# Patient Record
Sex: Female | Born: 1960 | Race: White | Hispanic: No | Marital: Single | Smoking: Former smoker
Health system: Southern US, Community
[De-identification: ages and names within clinical notes are randomized; demographics above are authoritative.]

## PROBLEM LIST (undated history)

## (undated) DIAGNOSIS — I10 Essential (primary) hypertension: Secondary | ICD-10-CM

## (undated) DIAGNOSIS — E785 Hyperlipidemia, unspecified: Secondary | ICD-10-CM

## (undated) DIAGNOSIS — I2510 Atherosclerotic heart disease of native coronary artery without angina pectoris: Secondary | ICD-10-CM

## (undated) DIAGNOSIS — F329 Major depressive disorder, single episode, unspecified: Secondary | ICD-10-CM

## (undated) DIAGNOSIS — F32A Depression, unspecified: Secondary | ICD-10-CM

## (undated) DIAGNOSIS — S022XXA Fracture of nasal bones, initial encounter for closed fracture: Secondary | ICD-10-CM

## (undated) DIAGNOSIS — F419 Anxiety disorder, unspecified: Secondary | ICD-10-CM

## (undated) DIAGNOSIS — K219 Gastro-esophageal reflux disease without esophagitis: Secondary | ICD-10-CM

## (undated) HISTORY — PX: CARPAL TUNNEL RELEASE: SHX101

## (undated) HISTORY — DX: Atherosclerotic heart disease of native coronary artery without angina pectoris: I25.10

## (undated) HISTORY — PX: PLANTAR FASCIA RELEASE: SHX2239

## (undated) HISTORY — PX: ABDOMINAL HYSTERECTOMY: SHX81

## (undated) HISTORY — PX: TONSILLECTOMY: SUR1361

---
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
## 2017-08-15 ENCOUNTER — Emergency Department (HOSPITAL_COMMUNITY): Payer: BC Managed Care – PPO

## 2017-08-15 ENCOUNTER — Other Ambulatory Visit: Payer: Self-pay

## 2017-08-15 ENCOUNTER — Encounter (HOSPITAL_COMMUNITY): Payer: Self-pay | Admitting: Emergency Medicine

## 2017-08-15 ENCOUNTER — Emergency Department (HOSPITAL_COMMUNITY)
Admission: EM | Admit: 2017-08-15 | Discharge: 2017-08-15 | Disposition: A | Discharge status: Home / Self Care | Payer: BC Managed Care – PPO | Attending: Emergency Medicine | Admitting: Emergency Medicine

## 2017-08-15 DIAGNOSIS — M62838 Other muscle spasm: Secondary | ICD-10-CM

## 2017-08-15 DIAGNOSIS — R200 Anesthesia of skin: Secondary | ICD-10-CM | POA: Diagnosis not present

## 2017-08-15 DIAGNOSIS — R4781 Slurred speech: Secondary | ICD-10-CM | POA: Insufficient documentation

## 2017-08-15 DIAGNOSIS — R410 Disorientation, unspecified: Secondary | ICD-10-CM | POA: Diagnosis not present

## 2017-08-15 DIAGNOSIS — Z139 Encounter for screening, unspecified: Secondary | ICD-10-CM

## 2017-08-15 HISTORY — DX: Hyperlipidemia, unspecified: E78.5

## 2017-08-15 HISTORY — DX: Essential (primary) hypertension: I10

## 2017-08-15 LAB — I-STAT TROPONIN, ED: Troponin i, poc: 0 ng/mL (ref 0.00–0.08)

## 2017-08-15 LAB — COMPREHENSIVE METABOLIC PANEL
ALK PHOS: 104 U/L (ref 38–126)
ALT: 16 U/L (ref 0–44)
AST: 23 U/L (ref 15–41)
Albumin: 3.9 g/dL (ref 3.5–5.0)
Anion gap: 10 (ref 5–15)
BILIRUBIN TOTAL: 0.7 mg/dL (ref 0.3–1.2)
BUN: 7 mg/dL (ref 6–20)
CO2: 19 mmol/L — ABNORMAL LOW (ref 22–32)
Calcium: 8.7 mg/dL — ABNORMAL LOW (ref 8.9–10.3)
Chloride: 111 mmol/L (ref 98–111)
Creatinine, Ser: 0.85 mg/dL (ref 0.44–1.00)
GFR calc Af Amer: >60 mL/min (ref >60)
GFR calc non Af Amer: >60 mL/min (ref >60)
Glucose, Bld: 130 mg/dL — ABNORMAL HIGH (ref 70–99)
Potassium: 3.1 mmol/L — ABNORMAL LOW (ref 3.5–5.1)
Sodium: 140 mmol/L (ref 135–145)
TOTAL PROTEIN: 6.6 g/dL (ref 6.5–8.1)

## 2017-08-15 LAB — DIFFERENTIAL
Basophils Absolute: 0 10*3/uL (ref 0.0–0.1)
Basophils Relative: 0 %
Eosinophils Absolute: 0.1 10*3/uL (ref 0.0–0.7)
Eosinophils Relative: 1 %
LYMPHS ABS: 3.9 10*3/uL (ref 0.7–4.0)
LYMPHS PCT: 35 %
Monocytes Absolute: 0.6 10*3/uL (ref 0.1–1.0)
Monocytes Relative: 6 %
NEUTROS ABS: 6.6 10*3/uL (ref 1.7–7.7)
NEUTROS PCT: 58 %

## 2017-08-15 LAB — CK: Total CK: 97 U/L (ref 38–234)

## 2017-08-15 LAB — I-STAT CHEM 8, ED
BUN: 4 mg/dL — AB (ref 6–20)
Calcium, Ion: 1.05 mmol/L — ABNORMAL LOW (ref 1.15–1.40)
Chloride: 111 mmol/L (ref 98–111)
Creatinine, Ser: 0.8 mg/dL (ref 0.44–1.00)
Glucose, Bld: 131 mg/dL — ABNORMAL HIGH (ref 70–99)
HCT: 39 % (ref 36.0–46.0)
Hemoglobin: 13.3 g/dL (ref 12.0–15.0)
Potassium: 3.3 mmol/L — ABNORMAL LOW (ref 3.5–5.1)
Sodium: 142 mmol/L (ref 135–145)
TCO2: 19 mmol/L — ABNORMAL LOW (ref 22–32)

## 2017-08-15 LAB — PROTIME-INR
INR: 0.95
Prothrombin Time: 12.6 seconds (ref 11.4–15.2)

## 2017-08-15 LAB — CBG MONITORING, ED: Glucose-Capillary: 132 mg/dL — ABNORMAL HIGH (ref 70–99)

## 2017-08-15 LAB — APTT: aPTT: 29 seconds (ref 24–36)

## 2017-08-15 LAB — URINALYSIS, ROUTINE W REFLEX MICROSCOPIC
Bilirubin Urine: NEGATIVE
Glucose, UA: NEGATIVE mg/dL
HGB URINE DIPSTICK: NEGATIVE
Ketones, ur: NEGATIVE mg/dL
LEUKOCYTES UA: NEGATIVE
Nitrite: NEGATIVE
PROTEIN: NEGATIVE mg/dL
Specific Gravity, Urine: 1.002 — ABNORMAL LOW (ref 1.005–1.030)
pH: 9 — ABNORMAL HIGH (ref 5.0–8.0)

## 2017-08-15 LAB — CBC
HCT: 39.2 % (ref 36.0–46.0)
HEMOGLOBIN: 13.6 g/dL (ref 12.0–15.0)
MCH: 30.9 pg (ref 26.0–34.0)
MCHC: 34.7 g/dL (ref 30.0–36.0)
MCV: 89.1 fL (ref 78.0–100.0)
Platelets: 268 10*3/uL (ref 150–400)
RBC: 4.4 MIL/uL (ref 3.87–5.11)
RDW: 13.7 % (ref 11.5–15.5)
WBC: 11.2 10*3/uL — ABNORMAL HIGH (ref 4.0–10.5)

## 2017-08-15 LAB — RAPID URINE DRUG SCREEN, HOSP PERFORMED
Amphetamines: NOT DETECTED
BARBITURATES: NOT DETECTED
BENZODIAZEPINES: NOT DETECTED
Cocaine: NOT DETECTED
Opiates: NOT DETECTED
Tetrahydrocannabinol: NOT DETECTED

## 2017-08-15 LAB — MAGNESIUM: MAGNESIUM: 1.9 mg/dL (ref 1.7–2.4)

## 2017-08-15 LAB — ETHANOL: Alcohol, Ethyl (B): <10 mg/dL (ref <10)

## 2017-08-15 MED ORDER — POTASSIUM CHLORIDE CRYS ER 20 MEQ PO TBCR
40.0000 meq | EXTENDED_RELEASE_TABLET | Freq: Once | ORAL | Status: AC
  Administered 2017-08-15: 40 meq via ORAL
  Filled 2017-08-15: qty 2

## 2017-08-15 MED ORDER — DIAZEPAM 2 MG PO TABS
2.0000 mg | ORAL_TABLET | Freq: Once | ORAL | Status: AC
  Administered 2017-08-15: 2 mg via ORAL
  Filled 2017-08-15: qty 1

## 2017-08-15 MED ORDER — SODIUM CHLORIDE 0.9 % IV BOLUS
1000.0000 mL | Freq: Once | INTRAVENOUS | Status: AC
Start: 2017-08-15 — End: 2017-08-15
  Administered 2017-08-15: 1000 mL via INTRAVENOUS

## 2017-08-15 NOTE — ED Triage Notes (Signed)
Per EMS, pt from home. Family called out for for unresponsiveness. Pt was found minimal responsive in bathtub, pt had just come back in from outside. Family reported slurred speech, numbness in bilateral hands. EMS states, upon arrival, pt noted to have LT sided facial droop and slurred speech. Pt also reports spasms in bilateral upper and lower extremities. AOx4. EDP at bedside to evaluate.
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
## 2017-08-15 NOTE — ED Notes (Signed)
TTS in progress 

## 2017-08-15 NOTE — ED Notes (Signed)
EKG given to Dr. McManus.  

## 2017-08-15 NOTE — Consult Note (Signed)
   [facility] Tele[facility]urology Consult Services    Date of Service:   08/15/2017 15:02:21  Impression:      .  Stress related  Metrics: Last Known Well: 08/15/2017 14:30:00 Start Time: 08/15/2017 15:01:20 Arrival Time: 08/15/2017 14:56:00 Stamp Time: 08/15/2017 15:02:21 Time First Login Attempt: 08/15/2017 15:07:00 Video Start Time: 08/15/2017 15:07:00  Symptoms: Unresponsive and hand numbness NIHSS Start Assessment Time: 08/15/2017 15:15:00 Patient is not a candidate for tPA. Patient was not deemed candidate for tPA thrombolytics because of Exam non focal and presentation not suggestive for stroke. Video End Time: 08/15/2017 15:20:00  CT head showed no acute hemorrhage or acute core infarct. CT head was reviewed.  Advanced imaging was not obtained as the presentation was not suggestive of Large Vessel Occlusive Disease.  ER physician notified of the decision on thrombolytics management.  Comments: Toxic and metabolic work up Drug screen Psych consult  Our recommendations are outlined below.  Recommendations:   Routine Consultation with Inhouse Neurology for Follow up Care  Sign Out:      .  Discussed with Emergency Department Provider    ------------------------------------------------------------------------------  History of Present Illness: Patient is a 57 years old Female who presents with symptoms of Unresponsive and hand numbness   Patient was found in the bathtub unresponsive and confused. When arrived to ED she was having bilateral hand numbness and spasming. She was last seen normal somewhere between [phone]. When i saw her she was having a lot of non rhythmic movement, in pain, she is talking and following commands during. Moves all ext equally.  Stroke alert called for EMS presentations  Examination: 1A: Level of Consciousness - Alert; keenly responsive + 0 1B: Ask Month and Age - Both Questions Right + 0 1C: Blink Eyes & Squeeze Hands -  Performs Both Tasks + 0 2: Test Horizontal Extraocular Movements - Normal + 0 3: Test Visual Fields - No Visual Loss + 0 4: Test Facial Palsy (Use Grimace if Obtunded) - Normal symmetry + 0 5A: Test Left Arm Motor Drift - No Drift for 10 Seconds + 0 5B: Test Right Arm Motor Drift - No Drift for 10 Seconds + 0 6A: Test Left Leg Motor Drift - No Drift for 5 Seconds + 0 6B: Test Right Leg Motor Drift - No Drift for 5 Seconds + 0 7: Test Limb Ataxia (FNF/Heel-Shin) - No Ataxia + 0 8: Test Sensation - Normal; No sensory loss + 0 9: Test Language/Aphasia - Normal; No aphasia + 0 10: Test Dysarthria - Normal + 0 11: Test Extinction/Inattention - No abnormality + 0  NIHSS Score: 0  Patient was informed the Neurology Consult would happen via TeleHealth consult by way of interactive audio and video telecommunications and consented to receiving care in this manner.  Due to the immediate potential for life-threatening deterioration due to underlying acute neurologic illness, I spent 35 minutes providing critical care. This time includes time for face to face visit via telemedicine, review of medical records, imaging studies and discussion of findings with providers, the patient and/or family.   Dr Lorrin GoodellYazan Yakir Wenke   [facility] [phone]

## 2017-08-15 NOTE — ED Provider Notes (Signed)
[facility] Provider Note   CSN: [id number] Arrival date & time: 08/15/17  1456     History   Chief Complaint Chief Complaint  Patient presents with  . Code Stroke    HPI Miranda Christensen is a 57 y.o. female.  The history is provided by the patient, the EMS personnel and a relative. The history is limited by the condition of the patient (slurred speech).  Pt was seen at 1455. Per EMS and pt's family report:  Pt called EMS after finding pt "minimally responsive" in the bathtub." Family states pt had "slurred speech, numbness in her bilat hands, left sided facial droop" and were concerned regarding "stroke." Pt apparently had just come in from outside. LKW 30min PTA. EMS states pt was having "spasms" in her bilat UE's en route.   No past medical history on file.  There are no active problems to display for this patient.     OB History   None      Home Medications    Prior to Admission medications   Not on File    Family History No family history on file.  Social History Social History   Tobacco Use  . Smoking status: Not on file  Substance Use Topics  . Alcohol use: Not on file  . Drug use: Not on file     Allergies   Naproxen   Review of Systems Review of Systems  Unable to perform ROS: Mental status change     Physical Exam Updated Vital Signs Ht 5\' 3"  (1.6 m)   Wt 65.8 kg (145 lb)   BMI 25.69 kg/m   Physical Exam 1500: Physical examination:  Nursing notes reviewed; Vital signs and O2 SAT reviewed;  Constitutional: Well developed, Well nourished, Well hydrated, In no acute distress; Head:  Normocephalic, atraumatic; Eyes: EOMI, PERRL, No scleral icterus; ENMT: Mouth and pharynx normal, Mucous membranes moist; Neck: Supple, Full range of motion, No lymphadenopathy; Cardiovascular: Regular rate and rhythm, No gallop; Respiratory: Breath sounds clear & equal bilaterally, No wheezes. Hyperventilating at times. Normal respiratory  effort/excursion; Chest: Nontender, Movement normal; Abdomen: Soft, Nontender, Nondistended, Normal bowel sounds; Genitourinary: No CVA tenderness; Extremities: Peripheral pulses normal, No tenderness, No edema, No calf edema or asymmetry.; Neuro: Awake, alert. Laying eyes closed with her head turned left. Whispering speech, clear. No facial droop. Will not follow commands. Pt will "twitch" her bilat UE's (from shoulders/upper arms) intermittently.  Pt is seen to move her bilat LE's on stretcher spontaneously..; Skin: Color normal, Warm, Dry.     ED Treatments / Results  Labs (all labs ordered are listed, but only abnormal results are displayed)   EKG EKG Interpretation  Date/Time:  Monday August 15 2017 15:05:32 EDT Ventricular Rate:  86 PR Interval:    QRS Duration: 88 QT Interval:  394 QTC Calculation: 472 R Axis:   26 Text Interpretation:  Sinus rhythm Ventricular premature complex Baseline wander Artifact No old tracing to compare Confirmed by Samuel JesterMcManus, Myranda Pavone [phone]([id number]) on 08/15/2017 3:08:26 PM   Radiology   Procedures Procedures (including critical care time)  Medications Ordered in ED Medications  diazepam (VALIUM) tablet 2 mg (has no administration in time range)  sodium chloride 0.9 % bolus 1,000 mL (0 mLs Intravenous Stopped 08/15/17 1631)  potassium chloride SA (K-DUR,KLOR-CON) CR tablet 40 mEq (40 mEq Oral Given 08/15/17 1614)     Initial Impression / Assessment and Plan / ED Course  I have reviewed the triage vital signs and  the nursing notes.  Pertinent labs & imaging results that were available during my care of the patient were reviewed by me and considered in my medical decision making (see chart for details).  MDM Reviewed: nursing note and vitals Reviewed previous: labs Interpretation: labs, ECG and CT scan Total time providing critical care: 30-74 minutes. This excludes time spent performing separately reportable procedures and services. Consults:  neurology and admitting MD    CRITICAL CARE Performed by: Samuel Jester Total critical care time: 35 minutes Critical care time was exclusive of separately billable procedures and treating other patients. Critical care was necessary to treat or prevent imminent or life-threatening deterioration. Critical care was time spent personally by me on the following activities: development of treatment plan with patient and/or surrogate as well as nursing, discussions with consultants, evaluation of patient's response to treatment, examination of patient, obtaining history from patient or surrogate, ordering and performing treatments and interventions, ordering and review of laboratory studies, ordering and review of radiographic studies, pulse oximetry and re-evaluation of patient's condition.  Results for orders placed or performed during the hospital encounter of 08/15/17  CK  Result Value Ref Range   Total CK 97 38 - 234 U/L  Ethanol  Result Value Ref Range   Alcohol, Ethyl (B) <10 <10 mg/dL  Protime-INR  Result Value Ref Range   Prothrombin Time 12.6 11.4 - 15.2 seconds   INR 0.95   APTT  Result Value Ref Range   aPTT 29 24 - 36 seconds  CBC  Result Value Ref Range   WBC 11.2 (H) 4.0 - 10.5 K/uL   RBC 4.40 3.87 - 5.11 MIL/uL   Hemoglobin 13.6 12.0 - 15.0 g/dL   HCT 40.9 81.1 - 91.4 %   MCV 89.1 78.0 - 100.0 fL   MCH 30.9 26.0 - 34.0 pg   MCHC 34.7 30.0 - 36.0 g/dL   RDW 78.2 95.6 - 21.3 %   Platelets 268 150 - 400 K/uL  Differential  Result Value Ref Range   Neutrophils Relative % 58 %   Neutro Abs 6.6 1.7 - 7.7 K/uL   Lymphocytes Relative 35 %   Lymphs Abs 3.9 0.7 - 4.0 K/uL   Monocytes Relative 6 %   Monocytes Absolute 0.6 0.1 - 1.0 K/uL   Eosinophils Relative 1 %   Eosinophils Absolute 0.1 0.0 - 0.7 K/uL   Basophils Relative 0 %   Basophils Absolute 0.0 0.0 - 0.1 K/uL  Comprehensive metabolic panel  Result Value Ref Range   Sodium 140 135 - 145 mmol/L   Potassium  3.1 (L) 3.5 - 5.1 mmol/L   Chloride 111 98 - 111 mmol/L   CO2 19 (L) 22 - 32 mmol/L   Glucose, Bld 130 (H) 70 - 99 mg/dL   BUN 7 6 - 20 mg/dL   Creatinine, Ser 0.86 0.44 - 1.00 mg/dL   Calcium 8.7 (L) 8.9 - 10.3 mg/dL   Total Protein 6.6 6.5 - 8.1 g/dL   Albumin 3.9 3.5 - 5.0 g/dL   AST 23 15 - 41 U/L   ALT 16 0 - 44 U/L   Alkaline Phosphatase 104 38 - 126 U/L   Total Bilirubin 0.7 0.3 - 1.2 mg/dL   GFR calc non Af Amer >60 >60 mL/min   GFR calc Af Amer >60 >60 mL/min   Anion gap 10 5 - 15  Urine rapid drug screen (hosp performed)not at [facility]  Result Value Ref Range   Opiates NONE DETECTED NONE  DETECTED   Cocaine NONE DETECTED NONE DETECTED   Benzodiazepines NONE DETECTED NONE DETECTED   Amphetamines NONE DETECTED NONE DETECTED   Tetrahydrocannabinol NONE DETECTED NONE DETECTED   Barbiturates NONE DETECTED NONE DETECTED  Urinalysis, Routine w reflex microscopic  Result Value Ref Range   Color, Urine STRAW (A) YELLOW   APPearance CLEAR CLEAR   Specific Gravity, Urine 1.002 (L) 1.005 - 1.030   pH 9.0 (H) 5.0 - 8.0   Glucose, UA NEGATIVE NEGATIVE mg/dL   Hgb urine dipstick NEGATIVE NEGATIVE   Bilirubin Urine NEGATIVE NEGATIVE   Ketones, ur NEGATIVE NEGATIVE mg/dL   Protein, ur NEGATIVE NEGATIVE mg/dL   Nitrite NEGATIVE NEGATIVE   Leukocytes, UA NEGATIVE NEGATIVE  Magnesium  Result Value Ref Range   Magnesium 1.9 1.7 - 2.4 mg/dL  I-Stat Chem 8, ED  (not at [facility], [facility])  Result Value Ref Range   Sodium 142 135 - 145 mmol/L   Potassium 3.3 (L) 3.5 - 5.1 mmol/L   Chloride 111 98 - 111 mmol/L   BUN 4 (L) 6 - 20 mg/dL   Creatinine, Ser 1.61 0.44 - 1.00 mg/dL   Glucose, Bld 096 (H) 70 - 99 mg/dL   Calcium, Ion 0.45 (L) 1.15 - 1.40 mmol/L   TCO2 19 (L) 22 - 32 mmol/L   Hemoglobin 13.3 12.0 - 15.0 g/dL   HCT 40.9 81.1 - 91.4 %  I-stat troponin, ED (not at [facility], [facility])  Result Value Ref Range   Troponin i, poc 0.00 0.00 - 0.08 ng/mL   Comment 3           Dg Chest Port 1  View Result Date: 08/15/2017 CLINICAL DATA:  Confusion. EXAM: PORTABLE CHEST 1 VIEW COMPARISON:  None. FINDINGS: The heart size and mediastinal contours are within normal limits. Both lungs are clear. The visualized skeletal structures are unremarkable. IMPRESSION: No active disease. Electronically Signed   By: Gerome Sam III M.D   On: 08/15/2017 16:16   Ct Head Code Stroke Wo Contrast Result Date: 08/15/2017 CLINICAL DATA:  Code stroke. Patient found in bathtub with confusion and slurred speech. EXAM: CT HEAD WITHOUT CONTRAST TECHNIQUE: Contiguous axial images were obtained from the base of the skull through the vertex without intravenous contrast. COMPARISON:  None. FINDINGS: The patient was unable to remain motionless for the exam. Small or subtle lesions could be overlooked. Brain: No evidence for acute infarction, hemorrhage, mass lesion, hydrocephalus, or extra-axial fluid. Normal for age cerebral volume. No definite white matter disease. Vascular: No hyperdense vessel or unexpected calcification. Skull: Normal. Negative for fracture or focal lesion. Sinuses/Orbits: No orbital masses or proptosis. Globes appear symmetric. Sinuses appear well aerated, without evidence for air-fluid level. Other: None. ASPECTS [facility] Stroke Program Early CT Score) - Ganglionic level infarction (caudate, lentiform nuclei, internal capsule, insula, M1-M3 cortex): 7 - Supraganglionic infarction (M4-M6 cortex): 3 Total score (0-10 with 10 being normal): 10 IMPRESSION: 1. Motion degraded scan demonstrating no acute or focal intracranial abnormality. 2. ASPECTS is 10. These results were called by telephone at the time of interpretation on 08/15/2017 at 3:28 pm to Dr. Samuel Jester , who verbally acknowledged these results. Electronically Signed   By: Elsie Stain M.D.   On: 08/15/2017 15:28     1500:  Pt will speak only very softly when questioned. Pt will not follow commands or fully answer examiner's questions. Pt  laying with her head turned left, despite examiner and ED staff standing to her right side and asking questions.  Pt will not follow commands, but is seen moving all extremities spontaneously on stretcher. Pt remains awake/alert while intermittently "twitching" her bilat UE's (shoulers/upper arms) only; her wrists/hands only follow this movement and do not "twitch" on their own. Doubt seizure activity at this time.   1530:  TeleNeuro Dr. Charolotte Capuchin has evaluated pt: states pt is not having neurological event at this time (stroke nor seizure), pt's family members in exam room state pt has "been under a lot of stress (family, financial)," states psych issue is the most likely cause for her symptoms today, recommends to complete tox workup and have psych eval. IVF bolused and potassium repleted PO.   1715:  Pt is now sitting up on stretcher, intermittently "twitching" her shoulders, states she "needs something for the spasms." Pt intermittently hyperventilating, agitated at family in exam room. No clear indication for medical admission at this time. Will dose valium and have TTS evaluate.   1930:  TTS has evaluated pt: preliminary verbal statements from TSS is that pt can be d/c, their note is pending. Family continues agitated, argumentative and demanding with ED staff. There does not appear to be a clear indication for medical admission at this time. T/C returned from [facility] Dr. Sharl Ma, case discussed, including:  HPI, pertinent PM/SHx, VS/PE, dx testing, ED course and treatment:  He has reviewed the chart with me, he agrees with EDP and that there is no criteria to admit pt medically at this time, f/u PMD and Neuro MD.   1945:  Pt no longer in exam room. Eloped.    Final Clinical Impressions(s) / ED Diagnoses   Final diagnoses:  None    ED Discharge Orders    None       Samuel Jester, DO 08/18/17 2235

## 2017-08-15 NOTE — ED Notes (Signed)
Patient noted to have left room without discharge orders.

## 2017-08-15 NOTE — Progress Notes (Signed)
CODE STROKE CALL 1454 BEEPER 1454 EXAM STARTED 1506 EXAM FINISHED 1511 IMAGES SENT TO SOC 1511 COMPLETED/CALLED GR 1518

## 2017-08-15 NOTE — ED Notes (Signed)
Patient unable to void, in and out cath performed, of clear, light yellow urine. Patient tolerated well.

## 2019-02-23 IMAGING — CR DG CHEST 1V PORT
1 series · 1 of 1 positions shown · non-contrast
Comparison: None.

CLINICAL DATA: Confusion.

EXAM:
PORTABLE CHEST 1 VIEW

[pa]
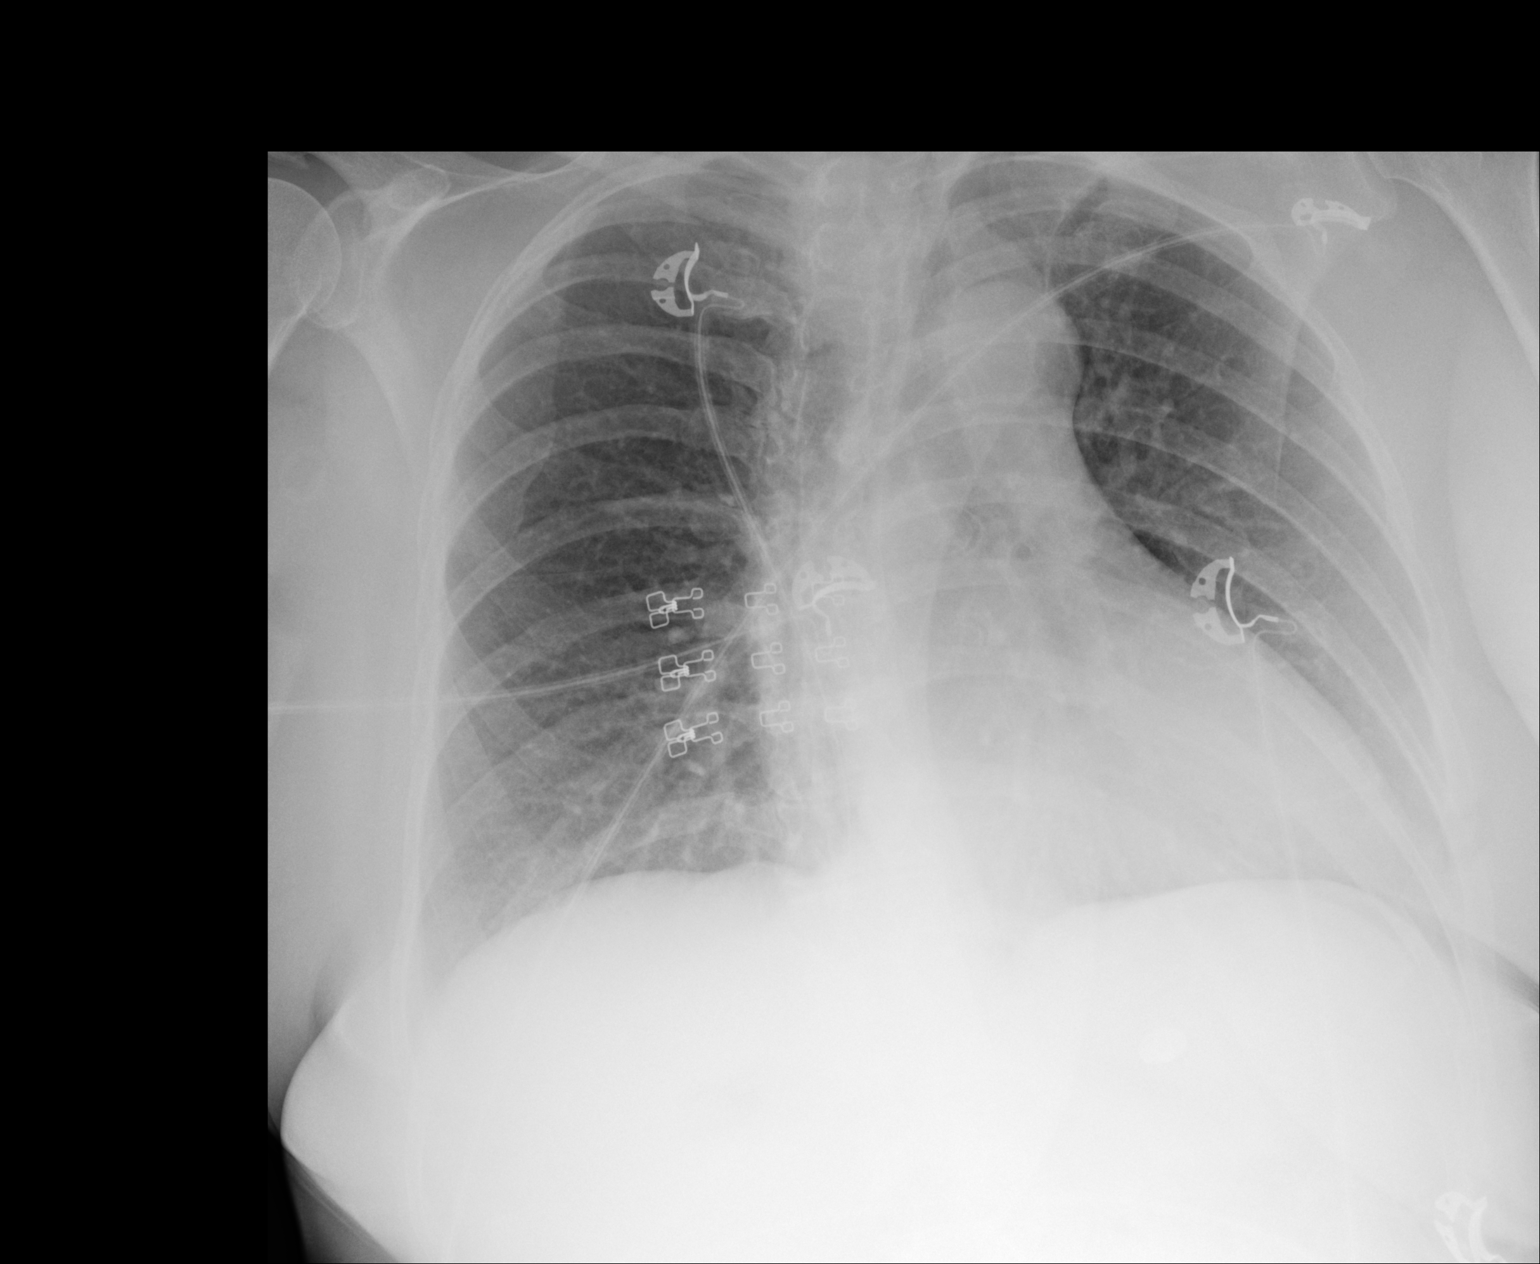

[1 of 1 positions shown; findings below may reference images not displayed]

FINDINGS: The heart size and mediastinal contours are within normal limits.
Both lungs are clear. The visualized skeletal structures are
unremarkable.
IMPRESSION: No active disease.

## 2019-03-21 ENCOUNTER — Other Ambulatory Visit: Payer: Self-pay | Admitting: Otolaryngology

## 2019-03-21 ENCOUNTER — Other Ambulatory Visit: Payer: Self-pay

## 2019-03-21 ENCOUNTER — Encounter (HOSPITAL_BASED_OUTPATIENT_CLINIC_OR_DEPARTMENT_OTHER): Payer: Self-pay | Admitting: Otolaryngology

## 2019-03-22 ENCOUNTER — Other Ambulatory Visit (HOSPITAL_COMMUNITY): Payer: Self-pay

## 2019-03-22 ENCOUNTER — Encounter (HOSPITAL_BASED_OUTPATIENT_CLINIC_OR_DEPARTMENT_OTHER): Payer: Self-pay | Admitting: Otolaryngology

## 2019-03-22 ENCOUNTER — Other Ambulatory Visit (HOSPITAL_COMMUNITY)
Admission: RE | Admit: 2019-03-22 | Discharge: 2019-03-22 | Disposition: A | Discharge status: Home / Self Care | Payer: BC Managed Care – PPO | Source: Ambulatory Visit | Attending: Otolaryngology | Admitting: Otolaryngology

## 2019-03-22 DIAGNOSIS — Z79899 Other long term (current) drug therapy: Secondary | ICD-10-CM | POA: Diagnosis not present

## 2019-03-22 DIAGNOSIS — S022XXA Fracture of nasal bones, initial encounter for closed fracture: Secondary | ICD-10-CM | POA: Diagnosis present

## 2019-03-22 DIAGNOSIS — E785 Hyperlipidemia, unspecified: Secondary | ICD-10-CM | POA: Diagnosis not present

## 2019-03-22 DIAGNOSIS — W19XXXA Unspecified fall, initial encounter: Secondary | ICD-10-CM | POA: Diagnosis not present

## 2019-03-22 DIAGNOSIS — Z87891 Personal history of nicotine dependence: Secondary | ICD-10-CM | POA: Diagnosis not present

## 2019-03-22 DIAGNOSIS — Z20822 Contact with and (suspected) exposure to covid-19: Secondary | ICD-10-CM | POA: Diagnosis not present

## 2019-03-22 DIAGNOSIS — I10 Essential (primary) hypertension: Secondary | ICD-10-CM | POA: Diagnosis not present

## 2019-03-22 DIAGNOSIS — J342 Deviated nasal septum: Secondary | ICD-10-CM | POA: Diagnosis not present

## 2019-03-22 LAB — RESPIRATORY PANEL BY RT PCR (FLU A&B, COVID)
Influenza A by PCR: NEGATIVE
Influenza B by PCR: NEGATIVE
SARS Coronavirus 2 by RT PCR: NEGATIVE

## 2019-03-23 ENCOUNTER — Encounter (HOSPITAL_BASED_OUTPATIENT_CLINIC_OR_DEPARTMENT_OTHER): Payer: Self-pay | Admitting: Otolaryngology

## 2019-03-23 ENCOUNTER — Encounter (HOSPITAL_BASED_OUTPATIENT_CLINIC_OR_DEPARTMENT_OTHER)
Admission: RE | Disposition: A | Discharge status: Home / Self Care | Payer: Self-pay | Source: Home / Self Care | Attending: Otolaryngology

## 2019-03-23 ENCOUNTER — Ambulatory Visit (HOSPITAL_BASED_OUTPATIENT_CLINIC_OR_DEPARTMENT_OTHER)
Admission: RE | Admit: 2019-03-23 | Discharge: 2019-03-23 | Disposition: A | Discharge status: Home / Self Care | Payer: BC Managed Care – PPO | Attending: Otolaryngology | Admitting: Otolaryngology

## 2019-03-23 ENCOUNTER — Ambulatory Visit (HOSPITAL_BASED_OUTPATIENT_CLINIC_OR_DEPARTMENT_OTHER): Payer: BC Managed Care – PPO | Admitting: Certified Registered"

## 2019-03-23 ENCOUNTER — Other Ambulatory Visit: Payer: Self-pay

## 2019-03-23 DIAGNOSIS — I10 Essential (primary) hypertension: Secondary | ICD-10-CM | POA: Insufficient documentation

## 2019-03-23 DIAGNOSIS — J342 Deviated nasal septum: Secondary | ICD-10-CM | POA: Insufficient documentation

## 2019-03-23 DIAGNOSIS — E785 Hyperlipidemia, unspecified: Secondary | ICD-10-CM | POA: Diagnosis not present

## 2019-03-23 DIAGNOSIS — S022XXA Fracture of nasal bones, initial encounter for closed fracture: Secondary | ICD-10-CM | POA: Diagnosis not present

## 2019-03-23 DIAGNOSIS — Z20822 Contact with and (suspected) exposure to covid-19: Secondary | ICD-10-CM | POA: Insufficient documentation

## 2019-03-23 DIAGNOSIS — Z87891 Personal history of nicotine dependence: Secondary | ICD-10-CM | POA: Insufficient documentation

## 2019-03-23 DIAGNOSIS — W19XXXA Unspecified fall, initial encounter: Secondary | ICD-10-CM | POA: Insufficient documentation

## 2019-03-23 DIAGNOSIS — Z79899 Other long term (current) drug therapy: Secondary | ICD-10-CM | POA: Insufficient documentation

## 2019-03-23 HISTORY — PX: CLOSED REDUCTION NASAL FRACTURE: SHX5365

## 2019-03-23 HISTORY — DX: Gastro-esophageal reflux disease without esophagitis: K21.9

## 2019-03-23 HISTORY — DX: Depression, unspecified: F32.A

## 2019-03-23 HISTORY — DX: Fracture of nasal bones, initial encounter for closed fracture: S02.2XXA

## 2019-03-23 HISTORY — DX: Anxiety disorder, unspecified: F41.9

## 2019-03-23 SURGERY — CLOSED REDUCTION, FRACTURE, NASAL BONE
Anesthesia: General | Site: Nose | Laterality: Bilateral

## 2019-03-23 MED ORDER — PHENYLEPHRINE 40 MCG/ML (10ML) SYRINGE FOR IV PUSH (FOR BLOOD PRESSURE SUPPORT)
PREFILLED_SYRINGE | INTRAVENOUS | Status: AC
  Filled 2019-03-23: qty 10

## 2019-03-23 MED ORDER — LACTATED RINGERS IV SOLN: INTRAVENOUS | Status: DC

## 2019-03-23 MED ORDER — FENTANYL CITRATE (PF) 100 MCG/2ML IJ SOLN
INTRAMUSCULAR | Status: AC
  Filled 2019-03-23: qty 2

## 2019-03-23 MED ORDER — SUCCINYLCHOLINE CHLORIDE 200 MG/10ML IV SOSY
PREFILLED_SYRINGE | INTRAVENOUS | Status: AC
  Filled 2019-03-23: qty 10

## 2019-03-23 MED ORDER — LIDOCAINE 2% (20 MG/ML) 5 ML SYRINGE
INTRAMUSCULAR | Status: DC | PRN
  Administered 2019-03-23: 80 mg via INTRAVENOUS

## 2019-03-23 MED ORDER — MIDAZOLAM HCL 2 MG/2ML IJ SOLN
1.0000 mg | INTRAMUSCULAR | Status: DC | PRN
  Administered 2019-03-23: 2 mg via INTRAVENOUS

## 2019-03-23 MED ORDER — PROPOFOL 10 MG/ML IV BOLUS
INTRAVENOUS | Status: DC | PRN
  Administered 2019-03-23: 200 mg via INTRAVENOUS

## 2019-03-23 MED ORDER — DEXAMETHASONE SODIUM PHOSPHATE 4 MG/ML IJ SOLN
INTRAMUSCULAR | Status: DC | PRN
  Administered 2019-03-23: 10 mg via INTRAVENOUS

## 2019-03-23 MED ORDER — EPHEDRINE 5 MG/ML INJ
INTRAVENOUS | Status: AC
  Filled 2019-03-23: qty 10

## 2019-03-23 MED ORDER — DEXAMETHASONE SODIUM PHOSPHATE 10 MG/ML IJ SOLN
INTRAMUSCULAR | Status: AC
  Filled 2019-03-23: qty 1

## 2019-03-23 MED ORDER — LIDOCAINE 2% (20 MG/ML) 5 ML SYRINGE
INTRAMUSCULAR | Status: AC
  Filled 2019-03-23: qty 5

## 2019-03-23 MED ORDER — FENTANYL CITRATE (PF) 100 MCG/2ML IJ SOLN: 25.0000 ug | INTRAMUSCULAR | Status: DC | PRN

## 2019-03-23 MED ORDER — GLYCOPYRROLATE 0.2 MG/ML IJ SOLN
INTRAMUSCULAR | Status: DC | PRN
  Administered 2019-03-23: .2 mg via INTRAVENOUS

## 2019-03-23 MED ORDER — ONDANSETRON HCL 4 MG/2ML IJ SOLN
INTRAMUSCULAR | Status: DC | PRN
  Administered 2019-03-23: 4 mg via INTRAVENOUS

## 2019-03-23 MED ORDER — ONDANSETRON HCL 4 MG/2ML IJ SOLN
INTRAMUSCULAR | Status: AC
  Filled 2019-03-23: qty 2

## 2019-03-23 MED ORDER — FENTANYL CITRATE (PF) 100 MCG/2ML IJ SOLN
50.0000 ug | INTRAMUSCULAR | Status: DC | PRN
  Administered 2019-03-23: 100 ug via INTRAVENOUS

## 2019-03-23 MED ORDER — OXYMETAZOLINE HCL 0.05 % NA SOLN
NASAL | Status: DC | PRN
  Administered 2019-03-23: 1 via TOPICAL

## 2019-03-23 MED ORDER — MIDAZOLAM HCL 2 MG/2ML IJ SOLN
INTRAMUSCULAR | Status: AC
  Filled 2019-03-23: qty 2

## 2019-03-23 MED ORDER — FENTANYL CITRATE (PF) 100 MCG/2ML IJ SOLN
25.0000 ug | INTRAMUSCULAR | Status: DC | PRN
  Administered 2019-03-23: 50 ug via INTRAVENOUS

## 2019-03-23 SURGICAL SUPPLY — 29 items
BENZOIN TINCTURE PRP APPL 2/3 (GAUZE/BANDAGES/DRESSINGS) ×2 IMPLANT
CANISTER SUCT 1200ML W/VALVE (MISCELLANEOUS) ×2 IMPLANT
CNTNR URN SCR LID CUP LEK RST (MISCELLANEOUS) ×1 IMPLANT
CONT SPEC 4OZ STRL OR WHT (MISCELLANEOUS) ×2
COVER BACK TABLE 60X90IN (DRAPES) ×2 IMPLANT
COVER MAYO STAND STRL (DRAPES) ×2 IMPLANT
DECANTER SPIKE VIAL GLASS SM (MISCELLANEOUS) IMPLANT
DEPRESSOR TONGUE BLADE STERILE (MISCELLANEOUS) IMPLANT
DRSG CURAD 3X16 NADH (PACKING) IMPLANT
DRSG TELFA 3X8 NADH (GAUZE/BANDAGES/DRESSINGS) IMPLANT
GAUZE PACKING IODOFORM 1/2 (PACKING) IMPLANT
GAUZE SPONGE 4X4 12PLY STRL LF (GAUZE/BANDAGES/DRESSINGS) ×2 IMPLANT
GLOVE BIO SURGEON STRL SZ7.5 (GLOVE) ×2 IMPLANT
GLOVE BIOGEL PI IND STRL 7.0 (GLOVE) ×1 IMPLANT
GLOVE BIOGEL PI INDICATOR 7.0 (GLOVE) ×1
GLOVE ECLIPSE 6.5 STRL STRAW (GLOVE) ×2 IMPLANT
NEEDLE PRECISIONGLIDE 27X1.5 (NEEDLE) IMPLANT
PATTIES SURGICAL .5 X3 (DISPOSABLE) ×2 IMPLANT
SHEET MEDIUM DRAPE 40X70 STRL (DRAPES) ×2 IMPLANT
SHEET SILICONE 2X3 0.03 REINF (MISCELLANEOUS) IMPLANT
SPLINT NASAL THERMO PLAST (MISCELLANEOUS) ×2 IMPLANT
SPONGE GAUZE 2X2 8PLY STRL LF (GAUZE/BANDAGES/DRESSINGS) IMPLANT
STRIP CLOSURE SKIN 1/2X4 (GAUZE/BANDAGES/DRESSINGS) ×2 IMPLANT
STRIP CLOSURE SKIN 1/4X4 (GAUZE/BANDAGES/DRESSINGS) IMPLANT
SUT ETHILON 3 0 PS 1 (SUTURE) IMPLANT
SYR CONTROL 10ML LL (SYRINGE) IMPLANT
TOWEL GREEN STERILE FF (TOWEL DISPOSABLE) ×2 IMPLANT
TUBE CONNECTING 20X1/4 (TUBING) ×2 IMPLANT
YANKAUER SUCT BULB TIP NO VENT (SUCTIONS) IMPLANT

## 2019-03-23 NOTE — Anesthesia Preprocedure Evaluation (Addendum)
Anesthesia Evaluation  Patient identified by MRN, date of birth, ID band Patient awake    Airway Mallampati: II  TM Distance: >3 FB     Dental   Pulmonary former smoker,    breath sounds clear to auscultation       Cardiovascular hypertension,  Rhythm:Regular Rate:Normal     Neuro/Psych    GI/Hepatic Neg liver ROS, GERD  ,  Endo/Other  negative endocrine ROS  Renal/GU negative Renal ROS     Musculoskeletal   Abdominal   Peds  Hematology   Anesthesia Other Findings   Reproductive/Obstetrics                             Anesthesia Physical Anesthesia Plan  ASA: II  Anesthesia Plan: General   Post-op Pain Management:    Induction: Intravenous  PONV Risk Score and Plan: 3 and Ondansetron and Dexamethasone  Airway Management Planned: LMA  Additional Equipment:   Intra-op Plan:   Post-operative Plan: Extubation in OR  Informed Consent: I have reviewed the patients History and Physical, chart, labs and discussed the procedure including the risks, benefits and alternatives for the proposed anesthesia with the patient or authorized representative who has indicated his/her understanding and acceptance.     Dental advisory given  Plan Discussed with: CRNA and Anesthesiologist  Anesthesia Plan Comments:         Anesthesia Quick Evaluation

## 2019-03-23 NOTE — Brief Op Note (Signed)
03/23/2019  12:14 PM  PATIENT:  Miranda Christensen  59 y.o. female  PRE-OPERATIVE DIAGNOSIS:  nasal fracture  POST-OPERATIVE DIAGNOSIS:  nasal fracture  PROCEDURE:  Procedure(s): CLOSED REDUCTION NASAL FRACTURE (Bilateral)  SURGEON:  Surgeon(s) and Role:    Christia Reading, MD - Primary  PHYSICIAN ASSISTANT:   ASSISTANTS: none   ANESTHESIA:   general  EBL:  10 mL   BLOOD ADMINISTERED:none  DRAINS: none   LOCAL MEDICATIONS USED:  NONE  SPECIMEN:  No Specimen  DISPOSITION OF SPECIMEN:  N/A  COUNTS:  YES  TOURNIQUET:  * No tourniquets in log *  DICTATION: .Other Dictation: Dictation Number [id number]  PLAN OF CARE: Discharge to home after PACU  PATIENT DISPOSITION:  PACU - hemodynamically stable.   Delay start of Pharmacological VTE agent (>24hrs) due to surgical blood loss or risk of bleeding: no

## 2019-03-23 NOTE — H&P (Signed)
Miranda Christensen is an 59 y.o. female.   Chief Complaint: Nasal fracture HPI: 59 year old female with displaced nasal fracture about two weeks ago.  Past Medical History:  Diagnosis Date  . Anxiety   . Depression   . GERD (gastroesophageal reflux disease)   . Hyperlipidemia   . Hypertension   . Nasal fracture     Past Surgical History:  Procedure Laterality Date  . ABDOMINAL HYSTERECTOMY    . CARPAL TUNNEL RELEASE Bilateral   . PLANTAR FASCIA RELEASE Bilateral   . TONSILLECTOMY      History reviewed. No pertinent family history. Social History:  reports that she has quit smoking. She has never used smokeless tobacco. She reports current alcohol use. She reports that she does not use drugs.  Allergies:  Allergies  Allergen Reactions  . Naproxen Nausea And Vomiting    Medications Prior to Admission  Medication Sig Dispense Refill  . estrogens, conjugated, (PREMARIN) 0.45 MG tablet Take 0.45 mg by mouth daily. Take daily for 21 days then do not take for 7 days.    Marland Kitchen lisinopril (PRINIVIL,ZESTRIL) 10 MG tablet Take 10 mg by mouth daily.  0  . montelukast (SINGULAIR) 10 MG tablet Take 10 mg by mouth every morning.   1  . pramipexole (MIRAPEX) 0.125 MG tablet Take 1 tablet by mouth at bedtime as needed (for restless leg).   1  . simvastatin (ZOCOR) 40 MG tablet Take 1 tablet by mouth daily.  0  . zolpidem (AMBIEN) 10 MG tablet Take 1 tablet by mouth at bedtime.   2    Results for orders placed or performed during the hospital encounter of 03/22/19 (from the past 48 hour(s))  Respiratory Panel by RT PCR (Flu A&B, Covid) - Nasopharyngeal Swab     Status: None   Collection Time: 03/22/19  8:09 AM   Specimen: Nasopharyngeal Swab  Result Value Ref Range   SARS Coronavirus 2 by RT PCR NEGATIVE NEGATIVE    Comment: (NOTE) SARS-CoV-2 target nucleic acids are NOT DETECTED. The SARS-CoV-2 RNA is generally detectable in upper respiratoy specimens during the acute phase of infection. The  lowest concentration of SARS-CoV-2 viral copies this assay can detect is 131 copies/mL. A negative result does not preclude SARS-Cov-2 infection and should not be used as the sole basis for treatment or other patient management decisions. A negative result may occur with  improper specimen collection/handling, submission of specimen other than nasopharyngeal swab, presence of viral mutation(s) within the areas targeted by this assay, and inadequate number of viral copies (<131 copies/mL). A negative result must be combined with clinical observations, patient history, and epidemiological information. The expected result is Negative. Fact Sheet for Patients:  https://www.moore.com/ Fact Sheet for Healthcare Providers:  https://www.young.biz/ This test is not yet ap proved or cleared by the [place] FDA and  has been authorized for detection and/or diagnosis of SARS-CoV-2 by FDA under an Emergency Use Authorization (EUA). This EUA will remain  in effect (meaning this test can be used) for the duration of the COVID-19 declaration under Section 564(b)(1) of the Act, 21 U.S.C. section 360bbb-3(b)(1), unless the authorization is terminated or revoked sooner.    Influenza A by PCR NEGATIVE NEGATIVE   Influenza B by PCR NEGATIVE NEGATIVE    Comment: (NOTE) The Xpert Xpress SARS-CoV-2/FLU/RSV assay is intended as an aid in  the diagnosis of influenza from Nasopharyngeal swab specimens and  should not be used as a sole basis for treatment. Nasal  washings and  aspirates are unacceptable for Xpert Xpress SARS-CoV-2/FLU/RSV  testing. Fact Sheet for Patients: PinkCheek.be Fact Sheet for Healthcare Providers: GravelBags.it This test is not yet approved or cleared by the [place] FDA and  has been authorized for detection and/or diagnosis of SARS-CoV-2 by  FDA under an Emergency Use  Authorization (EUA). This EUA will remain  in effect (meaning this test can be used) for the duration of the  Covid-19 declaration under Section 564(b)(1) of the Act, 21  U.S.C. section 360bbb-3(b)(1), unless the authorization is  terminated or revoked. Performed at [facility], [place] [address] [place], [place] [address]    No results found.  Review of Systems  All other systems reviewed and are negative.   Blood pressure 132/87, pulse 84, temperature (!) 97.1 F (36.2 C), temperature source Oral, resp. rate 16, height 5\' 3"  (1.6 m), weight 75.5 kg, SpO2 100 %. Physical Exam  Constitutional: She is oriented to person, place, and time. She appears well-developed and well-nourished. No distress.  HENT:  Head: Normocephalic and atraumatic.  Right Ear: External ear normal.  Left Ear: External ear normal.  Mouth/Throat: Oropharynx is clear and moist.  Rightward deviated external nose.  Eyes: Pupils are equal, round, and reactive to light. Conjunctivae and EOM are normal.  Cardiovascular: Normal rate.  Respiratory: Effort normal.  Musculoskeletal:     Cervical back: Normal range of motion and neck supple.  Neurological: She is alert and oriented to person, place, and time. No cranial nerve deficit.  Skin: Skin is warm and dry.  Psychiatric: She has a normal mood and affect. Her behavior is normal. Judgment and thought content normal.     Assessment/Plan Nasal fracture To OR for closed nasal reduction.  Melida Quitter, MD 03/23/2019, 11:36 AM

## 2019-03-23 NOTE — Discharge Instructions (Signed)

## 2019-03-23 NOTE — Anesthesia Procedure Notes (Signed)
Procedure Name: LMA Insertion Date/Time: 03/23/2019 11:54 AM Performed by: Ronnette Hila, CRNA Pre-anesthesia Checklist: Patient identified, Emergency Drugs available, Suction available and Patient being monitored Patient Re-evaluated:Patient Re-evaluated prior to induction Oxygen Delivery Method: Circle system utilized Preoxygenation: Pre-oxygenation with 100% oxygen Induction Type: IV induction Ventilation: Mask ventilation without difficulty LMA: LMA inserted LMA Size: 4.0 Number of attempts: 1 Airway Equipment and Method: Bite block Placement Confirmation: positive ETCO2 Tube secured with: Tape Dental Injury: Teeth and Oropharynx as per pre-operative assessment

## 2019-03-23 NOTE — Anesthesia Postprocedure Evaluation (Signed)
Anesthesia Post Note  Patient: Miranda Christensen  Procedure(s) Performed: CLOSED REDUCTION NASAL FRACTURE (Bilateral Nose)     Patient location during evaluation: PACU Anesthesia Type: General Level of consciousness: awake Pain management: pain level controlled Vital Signs Assessment: post-procedure vital signs reviewed and stable Respiratory status: spontaneous breathing Cardiovascular status: stable Postop Assessment: no apparent nausea or vomiting Anesthetic complications: no    Last Vitals:  Vitals:   03/23/19 1218 03/23/19 1230  BP: 135/90 (!) 139/94  Pulse: 96 88  Resp: 16 17  Temp: 36.6 C   SpO2: 100% 99%    Last Pain:  Vitals:   03/23/19 1230  TempSrc:   PainSc: 4                  Aldin Drees

## 2019-03-23 NOTE — Transfer of Care (Signed)
Immediate Anesthesia Transfer of Care Note  Patient: Miranda Christensen  Procedure(s) Performed: CLOSED REDUCTION NASAL FRACTURE (Bilateral Nose)  Patient Location: PACU  Anesthesia Type:General  Level of Consciousness: awake, alert , oriented and drowsy  Airway & Oxygen Therapy: Patient Spontanous Breathing and Patient connected to face mask oxygen  Post-op Assessment: Report given to RN and Post -op Vital signs reviewed and stable  Post vital signs: Reviewed and stable  Last Vitals:  Vitals Value Taken Time  BP 150/93 03/23/19 1217  Temp    Pulse 96 03/23/19 1219  Resp 12 03/23/19 1219  SpO2 100 % 03/23/19 1219  Vitals shown include unvalidated device data.  Last Pain:  Vitals:   03/23/19 1030  TempSrc: Oral  PainSc: 0-No pain         Complications: No apparent anesthesia complications

## 2019-03-23 NOTE — Op Note (Signed)
NAME: ERI, MCEVERS MEDICAL RECORD TD:[id number] ACCOUNT [id number] DATE OF BIRTH:[birth year] FACILITY: [facility] LOCATION: [facility] PHYSICIAN:Omario Ander DJenne Pane, MD  OPERATIVE REPORT  DATE OF PROCEDURE:  03/23/2019  PREOPERATIVE DIAGNOSIS:  Displaced nasal fracture.  POSTOPERATIVE DIAGNOSIS:  Displaced nasal fracture.  PROCEDURE:  Closed nasal reduction.  SURGEON:  Christia Reading, MD  ANESTHESIA:  General LMA.  COMPLICATIONS:  None.  INDICATIONS:  The patient is a 59 year old female who fell in the middle of the night a couple of weeks ago while dizzy and sustained a rightward displaced nasal fracture.  She presents to the operating room for surgical management.  FINDINGS:  The external nose is deviated toward the right, causing some limited breathing in the left side.  DESCRIPTION OF PROCEDURE:  The patient was identified in the holding room, informed consent having been obtained including discussion of risks, benefits and alternatives, the patient was brought to the operative suite and put on the operative table in  the supine position.  Anesthesia was induced and the patient was intubated with an LMA without difficulty.  The eyes were taped closed.  The face was draped.  Afrin-soaked pledgets were placed in both sides of the nose for several minutes and then  removed.  A Goldman elevator was then used to elevate the left nasal bones and reduce the nose in both sides to the midline using bimanual manipulation.  After this was completed, Afrin pledgets were repositioned in the nose.  The external nose was  painted with benzoin and custom-cut Steri-Strips were placed.  A thermoplast splint was cut to fit the nose and placed in hot water until malleable and laid over the nose until it hardened into place.  Pledgets were removed and the nasal passages were  suctioned.  She does have a leftward septal deviation that does not appear to be related to her injury.  At this point, she was returned to  anesthesia for wakeup and was extubated and taken to the recovery room in stable condition.  VN/NUANCE  D:03/23/2019 T:03/23/2019 JOB:[id number]

## 2019-03-26 ENCOUNTER — Encounter: Payer: Self-pay | Admitting: *Deleted

## 2021-06-06 ENCOUNTER — Other Ambulatory Visit: Payer: Self-pay

## 2021-06-06 ENCOUNTER — Emergency Department (HOSPITAL_COMMUNITY): Payer: BC Managed Care – PPO

## 2021-06-06 ENCOUNTER — Encounter (HOSPITAL_COMMUNITY): Payer: Self-pay | Admitting: Emergency Medicine

## 2021-06-06 ENCOUNTER — Inpatient Hospital Stay (HOSPITAL_COMMUNITY)
Admission: EM | Admit: 2021-06-06 | Discharge: 2021-06-08 | DRG: 282 | Disposition: A | Discharge status: Home / Self Care | Payer: BC Managed Care – PPO | Attending: Internal Medicine | Admitting: Internal Medicine

## 2021-06-06 ENCOUNTER — Observation Stay (HOSPITAL_COMMUNITY): Payer: BC Managed Care – PPO

## 2021-06-06 DIAGNOSIS — F32A Depression, unspecified: Secondary | ICD-10-CM | POA: Diagnosis present

## 2021-06-06 DIAGNOSIS — R079 Chest pain, unspecified: Principal | ICD-10-CM | POA: Diagnosis present

## 2021-06-06 DIAGNOSIS — E785 Hyperlipidemia, unspecified: Secondary | ICD-10-CM | POA: Diagnosis present

## 2021-06-06 DIAGNOSIS — Z90710 Acquired absence of both cervix and uterus: Secondary | ICD-10-CM

## 2021-06-06 DIAGNOSIS — Z809 Family history of malignant neoplasm, unspecified: Secondary | ICD-10-CM

## 2021-06-06 DIAGNOSIS — I214 Non-ST elevation (NSTEMI) myocardial infarction: Secondary | ICD-10-CM | POA: Diagnosis present

## 2021-06-06 DIAGNOSIS — I249 Acute ischemic heart disease, unspecified: Secondary | ICD-10-CM | POA: Diagnosis present

## 2021-06-06 DIAGNOSIS — I10 Essential (primary) hypertension: Secondary | ICD-10-CM | POA: Diagnosis present

## 2021-06-06 DIAGNOSIS — F419 Anxiety disorder, unspecified: Secondary | ICD-10-CM | POA: Diagnosis present

## 2021-06-06 DIAGNOSIS — R0789 Other chest pain: Secondary | ICD-10-CM | POA: Diagnosis present

## 2021-06-06 DIAGNOSIS — K219 Gastro-esophageal reflux disease without esophagitis: Secondary | ICD-10-CM | POA: Diagnosis present

## 2021-06-06 DIAGNOSIS — Z87891 Personal history of nicotine dependence: Secondary | ICD-10-CM | POA: Diagnosis not present

## 2021-06-06 DIAGNOSIS — Z6832 Body mass index (BMI) 32.0-32.9, adult: Secondary | ICD-10-CM

## 2021-06-06 DIAGNOSIS — I25110 Atherosclerotic heart disease of native coronary artery with unstable angina pectoris: Secondary | ICD-10-CM | POA: Diagnosis present

## 2021-06-06 DIAGNOSIS — E663 Overweight: Secondary | ICD-10-CM | POA: Diagnosis present

## 2021-06-06 DIAGNOSIS — Z79899 Other long term (current) drug therapy: Secondary | ICD-10-CM

## 2021-06-06 DIAGNOSIS — R778 Other specified abnormalities of plasma proteins: Secondary | ICD-10-CM

## 2021-06-06 HISTORY — DX: Depression, unspecified: F32.A

## 2021-06-06 HISTORY — DX: Anxiety disorder, unspecified: F41.9

## 2021-06-06 LAB — HEMOGLOBIN A1C
Hgb A1c MFr Bld: 5.9 % — ABNORMAL HIGH (ref 4.8–5.6)
Mean Plasma Glucose: 122.63 mg/dL

## 2021-06-06 LAB — RAPID URINE DRUG SCREEN, HOSP PERFORMED
Amphetamines: NOT DETECTED
Barbiturates: NOT DETECTED
Benzodiazepines: NOT DETECTED
Cocaine: NOT DETECTED
Opiates: NOT DETECTED
Tetrahydrocannabinol: NOT DETECTED

## 2021-06-06 LAB — CBC
HCT: 42.1 % (ref 36.0–46.0)
Hemoglobin: 13.9 g/dL (ref 12.0–15.0)
MCH: 29.4 pg (ref 26.0–34.0)
MCHC: 33 g/dL (ref 30.0–36.0)
MCV: 89.2 fL (ref 80.0–100.0)
Platelets: 301 10*3/uL (ref 150–400)
RBC: 4.72 MIL/uL (ref 3.87–5.11)
RDW: 14.2 % (ref 11.5–15.5)
WBC: 9.7 10*3/uL (ref 4.0–10.5)
nRBC: 0 % (ref 0.0–0.2)

## 2021-06-06 LAB — LIPID PANEL
Cholesterol: 203 mg/dL — ABNORMAL HIGH (ref 0–200)
HDL: 76 mg/dL (ref >40)
LDL Cholesterol: 112 mg/dL — ABNORMAL HIGH (ref 0–99)
Total CHOL/HDL Ratio: 2.7 RATIO
Triglycerides: 77 mg/dL (ref <150)
VLDL: 15 mg/dL (ref 0–40)

## 2021-06-06 LAB — ECHOCARDIOGRAM COMPLETE
Area-P 1/2: 4.49 cm2
Height: 63 in
S' Lateral: 2.9 cm
Weight: 2916.8 oz

## 2021-06-06 LAB — BASIC METABOLIC PANEL
Anion gap: 12 (ref 5–15)
BUN: 10 mg/dL (ref 6–20)
CO2: 22 mmol/L (ref 22–32)
Calcium: 9 mg/dL (ref 8.9–10.3)
Chloride: 103 mmol/L (ref 98–111)
Creatinine, Ser: 1.09 mg/dL — ABNORMAL HIGH (ref 0.44–1.00)
GFR, Estimated: 58 mL/min — ABNORMAL LOW (ref >60)
Glucose, Bld: 150 mg/dL — ABNORMAL HIGH (ref 70–99)
Potassium: 4.5 mmol/L (ref 3.5–5.1)
Sodium: 137 mmol/L (ref 135–145)

## 2021-06-06 LAB — PROTIME-INR
INR: 0.9 (ref 0.8–1.2)
Prothrombin Time: 12.3 seconds (ref 11.4–15.2)

## 2021-06-06 LAB — HEPARIN LEVEL (UNFRACTIONATED): Heparin Unfractionated: 0.15 IU/mL — ABNORMAL LOW (ref 0.30–0.70)

## 2021-06-06 LAB — TSH: TSH: 1.615 u[IU]/mL (ref 0.350–4.500)

## 2021-06-06 LAB — TROPONIN I (HIGH SENSITIVITY)
Troponin I (High Sensitivity): 16 ng/L (ref <18)
Troponin I (High Sensitivity): 63 ng/L — ABNORMAL HIGH (ref <18)
Troponin I (High Sensitivity): 85 ng/L — ABNORMAL HIGH (ref <18)
Troponin I (High Sensitivity): 83 ng/L — ABNORMAL HIGH (ref <18)

## 2021-06-06 LAB — I-STAT BETA HCG BLOOD, ED (MC, WL, AP ONLY): I-stat hCG, quantitative: <5 m[IU]/mL (ref <5)

## 2021-06-06 LAB — MAGNESIUM: Magnesium: 2.1 mg/dL (ref 1.7–2.4)

## 2021-06-06 MED ORDER — METOPROLOL SUCCINATE ER 25 MG PO TB24
25.0000 mg | ORAL_TABLET | Freq: Every day | ORAL | Status: DC
  Administered 2021-06-06: 25 mg via ORAL
  Filled 2021-06-06 (×2): qty 1

## 2021-06-06 MED ORDER — HEPARIN BOLUS VIA INFUSION
4000.0000 [IU] | Freq: Once | INTRAVENOUS | Status: AC
  Administered 2021-06-06: 4000 [IU] via INTRAVENOUS
  Filled 2021-06-06: qty 4000

## 2021-06-06 MED ORDER — MONTELUKAST SODIUM 10 MG PO TABS
10.0000 mg | ORAL_TABLET | Freq: Every morning | ORAL | Status: DC
  Administered 2021-06-06 – 2021-06-08 (×3): 10 mg via ORAL
  Filled 2021-06-06 (×3): qty 1

## 2021-06-06 MED ORDER — ONDANSETRON HCL 4 MG/2ML IJ SOLN: 4.0000 mg | Freq: Four times a day (QID) | INTRAMUSCULAR | Status: DC | PRN

## 2021-06-06 MED ORDER — NITROGLYCERIN 0.4 MG SL SUBL: 0.4000 mg | SUBLINGUAL_TABLET | SUBLINGUAL | Status: DC | PRN

## 2021-06-06 MED ORDER — HEPARIN (PORCINE) 25000 UT/250ML-% IV SOLN
1000.0000 [IU]/h | INTRAVENOUS | Status: DC
  Administered 2021-06-06: 850 [IU]/h via INTRAVENOUS
  Administered 2021-06-07 – 2021-06-08 (×2): 1000 [IU]/h via INTRAVENOUS
  Filled 2021-06-06 (×3): qty 250

## 2021-06-06 MED ORDER — ENOXAPARIN SODIUM 40 MG/0.4ML IJ SOSY: 40.0000 mg | PREFILLED_SYRINGE | INTRAMUSCULAR | Status: DC

## 2021-06-06 MED ORDER — ACETAMINOPHEN 325 MG PO TABS: 650.0000 mg | ORAL_TABLET | Freq: Four times a day (QID) | ORAL | Status: DC | PRN

## 2021-06-06 MED ORDER — CYCLOSPORINE 0.05 % OP EMUL
1.0000 [drp] | Freq: Two times a day (BID) | OPHTHALMIC | Status: DC
  Administered 2021-06-07 – 2021-06-08 (×3): 1 [drp] via OPHTHALMIC
  Filled 2021-06-06 (×6): qty 30

## 2021-06-06 MED ORDER — LISINOPRIL 10 MG PO TABS
10.0000 mg | ORAL_TABLET | Freq: Every day | ORAL | Status: DC
  Administered 2021-06-06 – 2021-06-08 (×3): 10 mg via ORAL
  Filled 2021-06-06 (×3): qty 1

## 2021-06-06 MED ORDER — ASPIRIN 81 MG PO CHEW
81.0000 mg | CHEWABLE_TABLET | Freq: Every day | ORAL | Status: DC
  Administered 2021-06-06 – 2021-06-07 (×2): 81 mg via ORAL
  Filled 2021-06-06 (×2): qty 1

## 2021-06-06 MED ORDER — ACETAMINOPHEN 650 MG RE SUPP: 650.0000 mg | Freq: Four times a day (QID) | RECTAL | Status: DC | PRN

## 2021-06-06 MED ORDER — SIMVASTATIN 20 MG PO TABS
40.0000 mg | ORAL_TABLET | Freq: Every day | ORAL | Status: DC
  Administered 2021-06-06: 40 mg via ORAL
  Filled 2021-06-06: qty 2

## 2021-06-06 MED ORDER — ZOLPIDEM TARTRATE 5 MG PO TABS
5.0000 mg | ORAL_TABLET | Freq: Every day | ORAL | Status: DC
  Administered 2021-06-06 – 2021-06-07 (×2): 5 mg via ORAL
  Filled 2021-06-06 (×2): qty 1

## 2021-06-06 MED ORDER — ATORVASTATIN CALCIUM 80 MG PO TABS
80.0000 mg | ORAL_TABLET | Freq: Every day | ORAL | Status: DC
Start: 2021-06-07 — End: 2021-06-08
  Administered 2021-06-07 – 2021-06-08 (×2): 80 mg via ORAL
  Filled 2021-06-06 (×2): qty 1

## 2021-06-06 NOTE — ED Provider Notes (Signed)
[facility] Emergency Department Provider Note MRN:  [id number]  Arrival date & time: 06/06/21     Chief Complaint   Chest Pain   History of Present Illness   Miranda Christensen is a 61 y.o. year-old female presents to the ED with chief complaint of of chest pain that started tonight at 10:30pm.  It radiates to her arm and jaw.  Denies SOB. Was given 324mg  ASA and nitroglycerin by EMS.  Reports improvement, but not resolution of symptoms.  History provided by patient.   Review of Systems  Pertinent review of systems noted in HPI.    Physical Exam   Vitals:   06/06/21 0321 06/06/21 0325  BP: 109/90 116/90  Pulse: 86 91  Resp: 16 14  Temp: 97.8 F (36.6 C)   SpO2: 100% 98%    CONSTITUTIONAL:  well-appearing, NAD NEURO:  Alert and oriented x 3, CN 3-12 grossly intact EYES:  eyes equal and reactive ENT/NECK:  Supple, no stridor  CARDIO:  normal rate, regular rhythm, appears well-perfused  PULM:  No respiratory distress, CTAB GI/GU:  non-distended,  MSK/SPINE:  No gross deformities, no edema, moves all extremities  SKIN:  no rash, atraumatic   *Additional and/or pertinent findings included in MDM below  Diagnostic and Interventional Summary    EKG Interpretation  Date/Time:    Ventricular Rate:    PR Interval:    QRS Duration:   QT Interval:    QTC Calculation:   R Axis:     Text Interpretation:         Labs Reviewed  BASIC METABOLIC PANEL - Abnormal; Notable for the following components:      Result Value   Glucose, Bld 150 (*)    Creatinine, Ser 1.09 (*)    GFR, Estimated 58 (*)    All other components within normal limits  TROPONIN I (HIGH SENSITIVITY) - Abnormal; Notable for the following components:   Troponin I (High Sensitivity) 63 (*)    All other components within normal limits  CBC  I-STAT BETA HCG BLOOD, ED ([facility], [facility], [facility] ONLY)  TROPONIN I (HIGH SENSITIVITY)    DG Chest 2 View  Final Result      Medications - No data  to display   Procedures  /  Critical Care .Critical Care Performed by: 06/08/21, PA-C Authorized by: Roxy Horseman, PA-C   Critical care provider statement:    Critical care time (minutes):  34   Critical care was necessary to treat or prevent imminent or life-threatening deterioration of the following conditions:  Circulatory failure   Critical care was time spent personally by me on the following activities:  Development of treatment plan with patient or surrogate, discussions with consultants, evaluation of patient's response to treatment, examination of patient, ordering and review of laboratory studies, ordering and review of radiographic studies, ordering and performing treatments and interventions, pulse oximetry, re-evaluation of patient's condition and review of old charts  ED Course and Medical Decision Making  I have reviewed the triage vital signs, the nursing notes, and pertinent available records from the EMR.  Social Determinants Affecting Complexity of Care: Patient has no clinically significant social determinants affecting this chief complaint..   ED Course:   Patient here with chest pain.  Top differential diagnoses include ACS, PE, dissection. Medical Decision Making Problems Addressed: Elevated troponin: acute illness or injury Nonspecific chest pain: undiagnosed new problem with uncertain prognosis  Amount and/or Complexity of Data Reviewed Labs: ordered.  Details: Initial trop 16, repeat 63. Radiology: ordered and independent interpretation performed.    Details: CXR negative for opacity. ECG/medicine tests: independent interpretation performed.    Details: No ischemic changes  Risk Decision regarding hospitalization.     Consultants: I discussed the case with Hospitalist, Dr. Arlean Hopping, who is appreciated for admitting.   Treatment and Plan: Patient's exam and diagnostic results are concerning for ACS.  Feel that patient will need  admission to the hospital for further treatment and evaluation.    Final Clinical Impressions(s) / ED Diagnoses     ICD-10-CM   1. Nonspecific chest pain  R07.9     2. Elevated troponin  R77.8       ED Discharge Orders     None         Discharge Instructions Discussed with and Provided to Patient:   Discharge Instructions   None      Roxy Horseman, PA-C 06/06/21 0443    Mesner, Barbara Cower, MD 06/06/21 [phone]

## 2021-06-06 NOTE — Progress Notes (Signed)
ANTICOAGULATION CONSULT NOTE - Initial Consult  Pharmacy Consult for Heparin Indication: chest pain/ACS  Allergies  Allergen Reactions   Naproxen Nausea And Vomiting    Patient Measurements:   Heparin Dosing Weight: 71.2 kg  Vital Signs: Temp: 97.5 F (36.4 C) (05/20 0947) Temp Source: Oral (05/20 0947) BP: 129/91 (05/20 0947) Pulse Rate: 78 (05/20 0947)  Labs: Recent Labs    06/06/21 0115 06/06/21 0328 06/06/21 0958  HGB 13.9  --   --   HCT 42.1  --   --   PLT 301  --   --   CREATININE 1.09*  --   --   TROPONINIHS 16 63* 85*    CrCl cannot be calculated (Unknown ideal weight.).   Medical History: Past Medical History:  Diagnosis Date   Anxiety and depression    after losing her son 5 years ago   GERD (gastroesophageal reflux disease)    Hyperlipidemia    Hypertension    Nasal fracture     Medications:  (Not in a hospital admission)  Scheduled:   aspirin  81 mg Oral Daily   cycloSPORINE  1 drop Both Eyes BID   enoxaparin (LOVENOX) injection  40 mg Subcutaneous Q24H   lisinopril  10 mg Oral Daily   metoprolol succinate  25 mg Oral Daily   montelukast  10 mg Oral q morning   simvastatin  40 mg Oral Daily   zolpidem  5 mg Oral QHS   Infusions:  PRN: acetaminophen **OR** acetaminophen, nitroGLYCERIN, ondansetron (ZOFRAN) IV  Assessment: 61 yof with a history of anxiety with depression, HTN, HLD, GERD. Patient is presenting with chest pain. Heparin per pharmacy consult placed for chest pain/ACS.  Patient is not on anticoagulation prior to arrival.  Hgb 13.9; plt 301 hsTrop 63>85  Goal of Therapy:  Heparin level 0.3-0.7 units/ml Monitor platelets by anticoagulation protocol: Yes   Plan:  Give 4000 units bolus x 1 Start heparin infusion at 850 units/hr Check anti-Xa level in 6 hours and daily while on heparin Continue to monitor H&H and platelets  Delmar Landau, PharmD, BCPS 06/06/2021 11:27 AM ED Clinical Pharmacist -   [phone]

## 2021-06-06 NOTE — Consult Note (Addendum)
Cardiology Consultation:   Patient ID: Miranda Christensen MRN: [id number]; DOB: [birth year]  Admit date: 06/06/2021 Date of Consult: 06/06/2021  PCP:  Ernest Haber, MD   [place] Providers Cardiologist:  New to [facility]    Patient Profile:   Miranda Christensen is a 61 y.o. female with a hx of anxiety with depression, HTN, HLD, GERD, tobacco use who is being seen 06/06/2021 for the evaluation of chest pain at the request of Dr Lorin Mercy.  History of Present Illness:   Ms. Miranda Christensen with PMH above presented to ER today with chief complaints of chest pain that started last night around 10:30 PM.  Patient states she was sitting and watching TV when the pain started.  She denied doing anything exertional prior to this.  She reports midsternal chest pain that radiated to the entire anterior chest and bilateral arms.  She reports bilateral arm numbness.  She also reports associated shortness of breath, lightheadedness, diaphoresis, and nausea.  She felt her symptoms lasted about 2 hours.  Her husband called EMS and she received aspirin and nitroglycerin.  She felt her symptom subsided with the medication.  She is currently chest pain-free.  She denied any cardiac disease in the past.  She denied any significant family history of CAD.  She denied tobacco use, EtOH use, illicit drugs use.   Admission diagnostic so far revealed an unremarkable CBC and BMP.  High sensitive troponin 16>63.  hCG negative.  Chest x-ray no acute finding.  Twelve-lead EKG showed sinus rhythm with ventricular rate of 72 no acute ST-T changes.  She is hemodynamically stable at ED.  She is admitted to hospital medicine service, cardiology consult is requested for further input.   Past Medical History:  Diagnosis Date   Anxiety and depression    after losing her son 5 years ago   GERD (gastroesophageal reflux disease)    Hyperlipidemia    Hypertension    Nasal fracture     Past Surgical History:  Procedure Laterality Date    ABDOMINAL HYSTERECTOMY     CARPAL TUNNEL RELEASE Bilateral    CLOSED REDUCTION NASAL FRACTURE Bilateral 03/23/2019   Procedure: CLOSED REDUCTION NASAL FRACTURE;  Surgeon: Melida Quitter, MD;  Location: [place];  Service: ENT;  Laterality: Bilateral;   PLANTAR FASCIA RELEASE Bilateral    TONSILLECTOMY       Home Medications:  Prior to Admission medications   Medication Sig Start Date End Date Taking? Authorizing Provider  estradiol (ESTRACE) 0.5 MG tablet Take 0.5 mg by mouth daily. 04/29/21  Yes [provider]  lisinopril (PRINIVIL,ZESTRIL) 10 MG tablet Take 10 mg by mouth daily. 07/18/17  Yes [provider]  montelukast (SINGULAIR) 10 MG tablet Take 10 mg by mouth every morning.  07/23/17  Yes [provider]  pramipexole (MIRAPEX) 0.125 MG tablet Take 1 tablet by mouth at bedtime as needed (for restless leg).  05/16/17  Yes [provider]  RESTASIS 0.05 % ophthalmic emulsion Place 1 drop into both eyes 2 (two) times daily. 04/02/21  Yes [provider]  senna (SENOKOT) 8.6 MG TABS tablet Take 1 tablet by mouth daily as needed for mild constipation.   Yes [provider]  simvastatin (ZOCOR) 40 MG tablet Take 40 mg by mouth daily. 07/28/17  Yes [provider]  zolpidem (AMBIEN) 10 MG tablet Take 10 mg by mouth at bedtime. 07/18/17  Yes [provider]    Inpatient Medications: Scheduled Meds:  aspirin  81  mg Oral Daily   cycloSPORINE  1 drop Both Eyes BID   enoxaparin (LOVENOX) injection  40 mg Subcutaneous Q24H   lisinopril  10 mg Oral Daily   metoprolol succinate  25 mg Oral Daily   montelukast  10 mg Oral q morning   simvastatin  40 mg Oral Daily   zolpidem  5 mg Oral QHS   Continuous Infusions:  PRN Meds: acetaminophen **OR** acetaminophen, nitroGLYCERIN, ondansetron (ZOFRAN) IV  Allergies:    Allergies  Allergen Reactions   Naproxen Nausea And Vomiting    Social History:   Social History    Socioeconomic History   Marital status: Married    Spouse name: Not on file   Number of children: Not on file   Years of education: Not on file   Highest education level: Not on file  Occupational History   Occupation: Social research officer, government  Tobacco Use   Smoking status: Former    Packs/day: 0.50    Years: 20.00    Pack years: 10.00    Types: Cigarettes    Quit date: 2008    Years since quitting: 15.3   Smokeless tobacco: Never  Vaping Use   Vaping Use: Never used  Substance and Sexual Activity   Alcohol use: Yes    Comment: rare   Drug use: Never   Sexual activity: Not on file  Other Topics Concern   Not on file  Social History Narrative   Not on file   Social Determinants of Health   Financial Resource Strain: Not on file  Food Insecurity: Not on file  Transportation Needs: Not on file  Physical Activity: Not on file  Stress: Not on file  Social Connections: Not on file  Intimate Partner Violence: Not on file    Family History:    Family History  Problem Relation Age of Onset   Cancer Father    Dementia Father    CAD Neg Hx      ROS:  Constitutional: Denied fever, chills, malaise, night sweats Eyes: Denied vision change or loss Ears/Nose/Mouth/Throat: Denied ear ache, sore throat, coughing, sinus pain Cardiovascular: See HPI Respiratory: See HPI Gastrointestinal: See HPI Genital/Urinary: Denied dysuria, hematuria, urinary frequency/urgency Musculoskeletal: Denied muscle ache, joint pain, weakness Skin: Denied rash, wound Neuro: See HPI Psych: history of depression/anxiety  Endocrine: Denied history of diabetes   Physical Exam/Data:   Vitals:   06/06/21 0745 06/06/21 0800 06/06/21 0815 06/06/21 0947  BP: 126/86 123/85 125/86 (!) 129/91  Pulse: (!) 59 65 78 78  Resp: 14 13 18 12   Temp:    (!) 97.5 F (36.4 C)  TempSrc:    Oral  SpO2: 100% 100% 100% 100%   No intake or output data in the 24 hours ending 06/06/21 1115     03/23/2019   10:30 AM 03/21/2019    4:36 PM 08/15/2017    3:02 PM  Last 3 Weights  Weight (lbs) 166 lb 7.2 oz 165 lb 145 lb  Weight (kg) 75.5 kg 74.844 kg 65.772 kg     There is no height or weight on file to calculate BMI.   Vitals:  Vitals:   06/06/21 0815 06/06/21 0947  BP: 125/86 (!) 129/91  Pulse: 78 78  Resp: 18 12  Temp:  (!) 97.5 F (36.4 C)  SpO2: 100% 100%   General Appearance: In no apparent distress, laying in bed HEENT: Normocephalic, atraumatic.  Neck: Supple, trachea midline, no JVD Cardiovascular: Regular rate and rhythm, normal  S1-S2,  no murmur/rub/gallop, S3/S4 Respiratory: Resting breathing unlabored, lungs sounds clear to auscultation bilaterally, no use of accessory muscles. On room air.  No wheezes, rales or rhonchi.   Gastrointestinal: Bowel sounds positive, abdomen soft Extremities: Able to move all extremities in bed without difficulty, no edema Musculoskeletal: Normal muscle bulk and tone Skin: Intact, warm, dry. No rashes or petechiae noted in exposed areas.  Neurologic: Alert, oriented to person, place and time. Fluent speech, no facial droop, no cognitive deficit Psychiatric: Normal affect. Mood is appropriate.   EKG:  The EKG was personally reviewed and demonstrates: Sinus rhythm with ventricular rate 72 bpm  Telemetry:  Telemetry was personally reviewed and demonstrates: Sinus rhythm with occasional PVCs  Relevant CV Studies:  No cardiovascular prior work-up in the past  Laboratory Data:  High Sensitivity Troponin:   Recent Labs  Lab 06/06/21 0115 06/06/21 0328 06/06/21 0958  TROPONINIHS 16 63* 85*     Chemistry Recent Labs  Lab 06/06/21 0115 06/06/21 0958  NA 137  --   K 4.5  --   CL 103  --   CO2 22  --   GLUCOSE 150*  --   BUN 10  --   CREATININE 1.09*  --   CALCIUM 9.0  --   MG  --  2.1  GFRNONAA 58*  --   ANIONGAP 12  --     No results for input(s): PROT, ALBUMIN, AST, ALT, ALKPHOS, BILITOT in the last 168  hours. Lipids No results for input(s): CHOL, TRIG, HDL, LABVLDL, LDLCALC, CHOLHDL in the last 168 hours.  Hematology Recent Labs  Lab 06/06/21 0115  WBC 9.7  RBC 4.72  HGB 13.9  HCT 42.1  MCV 89.2  MCH 29.4  MCHC 33.0  RDW 14.2  PLT 301   Thyroid No results for input(s): TSH, FREET4 in the last 168 hours.  BNPNo results for input(s): BNP, PROBNP in the last 168 hours.  DDimer No results for input(s): DDIMER in the last 168 hours.   Radiology/Studies:  DG Chest 2 View  Result Date: 06/06/2021 CLINICAL DATA:  Chest pain.  Weakness. EXAM: CHEST - 2 VIEW COMPARISON:  08/15/2017 FINDINGS: Lung volumes are low.The cardiomediastinal contours are normal. Pulmonary vasculature is normal. No consolidation, pleural effusion, or pneumothorax. No acute osseous abnormalities are seen. IMPRESSION: Low lung volumes without acute abnormality. Electronically Signed   By: Keith Rake M.D.   On: 06/06/2021 01:44     Assessment and Plan:   NSTEMI -Presented with nonexertional chest pain 06/05/2021 night while watching TV, pain improved with nitro and aspirin -EKG without acute ischemic change -High sensitive troponin 16 >63 >95, will trend third troponin -Risk factor include hypertension, hyperlipidemia, overweight - will start heparin gtt, ASA 81mg , Metporolol 25mg  XL  - will check lipid profile, A1c, TSH; agree with UDS -Plan cardiac catheterization on Monday  Hypertension -Pressure controlled on lisinopril historically  Hyperlipidemia -On simvastatin, will update lipid profile  Anxiety with depression Asthma -Per hospital medicine    Risk Assessment/Risk Scores:    HEAR Score (for undifferentiated chest pain):  HEAR Score: 4  For questions or updates, please contact [place] Please consult [email or web address] for contact info under    Signed, Margie Billet, NP  06/06/2021 11:15 AM   Patient seen and examined.  Agree with above documentation.  Ms. Wolper is a 61 year old  female with a history of hypertension, hyperlipidemia, tobacco use who we are consulted by Dr. Lorin Mercy for evaluation of chest pain.  She reports she was watching TV last night around 1030 when she had the acute onset of chest pain.  Describes as pressure on left side of chest.  Felt like heart was racing and felt short of breath and lightheaded and diaphoretic.  Symptoms lasted 2 hours.  EMS was called and she was given aspirin and nitroglycerin with resolution of her symptoms.  On presentation to the ED, initial vital signs notable for BP 123/91, pulse 101, SPO2 100% on room air.  EKG shows sinus rhythm, rate 72, no ST abnormalities.  Labs notable for creatinine 1.09, hemoglobin 13.9, platelets 301, troponin 16 > 63 > 85 > 83.   On exam, patient is alert and oriented, regular rate and rhythm, no murmurs, lungs CTAB, no LE edema or JVD.  Her presentation is consistent with an NSTEMI, as presented with chest pain and had small rise/fall of troponin.  She has been chest pain-free since given sublingual nitroglycerin by EMS.  She does have significant CAD risk factors (age, hypertension, hyperlipidemia, tobacco use).  Started heparin drip, aspirin, metoprolol.  Switch from simvastatin to atorvastatin 80 mg.  Check echocardiogram.  Plan for cardiac catheterization on Monday.  Donato Heinz, MD

## 2021-06-06 NOTE — Progress Notes (Signed)
ANTICOAGULATION CONSULT NOTE - Initial Consult  Pharmacy Consult for Heparin Indication: chest pain/ACS  Allergies  Allergen Reactions   Naproxen Nausea And Vomiting    Patient Measurements: Height: 5\' 3"  (160 cm) Weight: 82.7 kg (182 lb 4.8 oz) IBW/kg (Calculated) : 52.4 Heparin Dosing Weight: 71.2 kg  Vital Signs: Temp: 97.8 F (36.6 C) (05/20 1459) Temp Source: Oral (05/20 1459) BP: 127/80 (05/20 1459) Pulse Rate: 56 (05/20 1459)  Labs: Recent Labs    06/06/21 0115 06/06/21 0328 06/06/21 0958 06/06/21 1255 06/06/21 1747  HGB 13.9  --   --   --   --   HCT 42.1  --   --   --   --   PLT 301  --   --   --   --   LABPROT  --   --   --  12.3  --   INR  --   --   --  0.9  --   HEPARINUNFRC  --   --   --   --  0.15*  CREATININE 1.09*  --   --   --   --   TROPONINIHS 16 63* 85* 83*  --      Estimated Creatinine Clearance: 55.9 mL/min (A) (by C-G formula based on SCr of 1.09 mg/dL (H)).   Medical History: Past Medical History:  Diagnosis Date   Anxiety and depression    after losing her son 5 years ago   GERD (gastroesophageal reflux disease)    Hyperlipidemia    Hypertension    Nasal fracture     Medications:  Medications Prior to Admission  Medication Sig Dispense Refill Last Dose   estradiol (ESTRACE) 0.5 MG tablet Take 0.5 mg by mouth daily.   06/05/2021   lisinopril (PRINIVIL,ZESTRIL) 10 MG tablet Take 10 mg by mouth daily.  0 06/05/2021   montelukast (SINGULAIR) 10 MG tablet Take 10 mg by mouth every morning.   1 06/05/2021   pramipexole (MIRAPEX) 0.125 MG tablet Take 1 tablet by mouth at bedtime as needed (for restless leg).   1 Past Week   RESTASIS 0.05 % ophthalmic emulsion Place 1 drop into both eyes 2 (two) times daily.   Past Week   senna (SENOKOT) 8.6 MG TABS tablet Take 1 tablet by mouth daily as needed for mild constipation.   Past Week   simvastatin (ZOCOR) 40 MG tablet Take 40 mg by mouth daily.  0 06/05/2021   zolpidem (AMBIEN) 10 MG tablet  Take 10 mg by mouth at bedtime.  2 Past Month    Scheduled:   aspirin  81 mg Oral Daily   [START ON 06/07/2021] atorvastatin  80 mg Oral Daily   cycloSPORINE  1 drop Both Eyes BID   lisinopril  10 mg Oral Daily   metoprolol succinate  25 mg Oral Daily   montelukast  10 mg Oral q morning   zolpidem  5 mg Oral QHS   Infusions:   heparin 850 Units/hr (06/06/21 1610)   PRN: acetaminophen **OR** acetaminophen, nitroGLYCERIN, ondansetron (ZOFRAN) IV  Assessment: 61 yof with a history of anxiety with depression, HTN, HLD, GERD. Patient is presenting with chest pain. Heparin per pharmacy consult placed for chest pain/ACS.  Patient is not on anticoagulation prior to arrival.  Hgb 13.9; plt 301 hsTrop 63>85  2nd: HL subtherapeutic at 0.15. No s/sx of bleed. Will increase accordingly.   Goal of Therapy:  Heparin level 0.3-0.7 units/ml Monitor platelets by anticoagulation protocol: Yes  Plan:  Increase heparin infusion to 1000 units/hr Check anti-Xa level in 6 hours and daily while on heparin Continue to monitor H&H and platelets  Isaias Sakai, PharmD, [facility] Pharmacy Resident [phone] 06/06/2021 7:29 PM

## 2021-06-06 NOTE — ED Triage Notes (Signed)
Patient arrived with EMS from home reports pain across her chest radiating to left arm onset last night with SOB , no emesis or diaphoresis . She received 1NTG sl and ASA 324 mg by EMS prior to arrival with relief.

## 2021-06-06 NOTE — Progress Notes (Signed)
  Carryover admission to the Day Admitter.  I discussed this case with the EDP, Roxy Horseman, PA.  Per these discussions:  This is a this is a 61 year old female who is being admitted for further evaluation and management presenting chest pain.  She reportedly experienced acute onset substernal chest pressure with radiation to the jaw starting at 2200 on 06/05/2021.  Pain was reportedly nonexertional, but improved with sublingual nitroglycerin administered via EMS in route to [facility] emergency department.  When she arrived at the ED, she reportedly had mild residual chest pain, subsequently resolved without any additional nitroglycerin.  Is now chest pain-free.  Her previous chest pain is reportedly associated with mild nausea in the absence of vomiting.  Vital signs reportedly stable.  Initial high-sensitivity troponin I found to be 16, with repeat value trending up to 63.  Serum potassium level 4.5. EKG reportedly shows sinus rhythm without any evidence of acute ischemic changes.  Chest x-ray shows no evidence of acute cardiopulmonary process.  She has received a full dose aspirin.   I have placed an order for observation to cardiac telemetry for further evaluation and management of presenting chest pain.  I have placed some additional preliminary admit orders via the adult multi-morbid admission order set. I have also ordered repeat troponin level to be checked at 8 AM, add on serum magnesium level, and ordered prn sublingual nitroglycerin. Case has not been discussed with cardiology.     Newton Pigg, DO Hospitalist

## 2021-06-06 NOTE — ED Notes (Signed)
Lab to add lipid and A1C to previous collection

## 2021-06-06 NOTE — H&P (Addendum)
History and Physical    Patient: Miranda Christensen ZJQ:[id number] DOB: Mar 25, 1960 DOA: 06/06/2021 DOS: the patient was seen and examined on 06/06/2021 PCP: Aldean Baker, MD  Patient coming from: Home - lives with daughter, her boyfriend, and her son; NOK: Daughter, Aundra Millet [phone]   Chief Complaint: chest pain  HPI: Miranda Christensen is a 61 y.o. female with medical history significant of anxiety/depression; HTN; and HLD presenting with CP.  She was sitting at work and her heart started pounding and she thought it was going to explode.   She could feel it in her neck and throat.  She got numb and tingling and SOB and she tried to settle it down and it just wouldn't.  Substernal, non-exertional, better with ASA and NTG.  It resolved completely and did not recur.  She has never had a stress test or ischemic evaluation.    ER Course:  Carryover, per Dr. Arlean Hopping:  Acute onset substernal chest pressure with radiation to the jaw starting at 2200 on 06/05/2021.  Pain nonexertional, but improved with sublingual nitroglycerin.  When she arrived at the ED, she reportedly had mild residual chest pain, subsequently resolved without any additional nitroglycerin, now chest pain-free.     Vital signs reportedly stable.  Initial high-sensitivity troponin I found to be 16, with repeat value trending up to 63.  Serum potassium level 4.5. EKG reportedly shows sinus rhythm without any evidence of acute ischemic changes.  Chest x-ray shows no evidence of acute cardiopulmonary process.  She has received a full dose aspirin.      Review of Systems: As mentioned in the history of present illness. All other systems reviewed and are negative. Past Medical History:  Diagnosis Date   Anxiety and depression    after losing her son 5 years ago   GERD (gastroesophageal reflux disease)    Hyperlipidemia    Hypertension    Nasal fracture    Past Surgical History:  Procedure Laterality Date   ABDOMINAL HYSTERECTOMY      CARPAL TUNNEL RELEASE Bilateral    CLOSED REDUCTION NASAL FRACTURE Bilateral 03/23/2019   Procedure: CLOSED REDUCTION NASAL FRACTURE;  Surgeon: Christia Reading, MD;  Location: [facility];  Service: ENT;  Laterality: Bilateral;   PLANTAR FASCIA RELEASE Bilateral    TONSILLECTOMY     Social History:  reports that she quit smoking about 15 years ago. Her smoking use included cigarettes. She has a 10.00 pack-year smoking history. She has never used smokeless tobacco. She reports current alcohol use. She reports that she does not use drugs.  Allergies  Allergen Reactions   Naproxen Nausea And Vomiting    Family History  Problem Relation Age of Onset   Cancer Father    Dementia Father    CAD Neg Hx     Prior to Admission medications   Medication Sig Start Date End Date Taking? Authorizing Provider  estradiol (ESTRACE) 0.5 MG tablet Take 0.5 mg by mouth daily. 04/29/21  Yes [provider]  lisinopril (PRINIVIL,ZESTRIL) 10 MG tablet Take 10 mg by mouth daily. 07/18/17  Yes [provider]  montelukast (SINGULAIR) 10 MG tablet Take 10 mg by mouth every morning.  07/23/17  Yes [provider]  pramipexole (MIRAPEX) 0.125 MG tablet Take 1 tablet by mouth at bedtime as needed (for restless leg).  05/16/17  Yes [provider]  RESTASIS 0.05 % ophthalmic emulsion Place 1 drop into both eyes 2 (two) times daily. 04/02/21  Yes [provider]  senna (SENOKOT) 8.6 MG TABS tablet Take 1 tablet by mouth daily as needed for mild constipation.   Yes [provider]  simvastatin (ZOCOR) 40 MG tablet Take 40 mg by mouth daily. 07/28/17  Yes [provider]  zolpidem (AMBIEN) 10 MG tablet Take 10 mg by mouth at bedtime. 07/18/17  Yes [provider]  estrogens, conjugated, (PREMARIN) 0.45 MG tablet Take 0.45 mg by mouth daily. Patient not taking: Reported on 06/06/2021    [provider]    Physical Exam: Vitals:    06/06/21 1300 06/06/21 1432 06/06/21 1434 06/06/21 1459  BP: 130/79  122/77 127/80  Pulse: 68 67 (!) 59 (!) 56  Resp: 17 15 14 15   Temp:    97.8 F (36.6 C)  TempSrc:    Oral  SpO2: 96% 99% 100% 100%  Weight:    82.7 kg  Height:    5\' 3"  (1.6 m)   General:  Appears calm and comfortable and is in NAD Eyes:  PERRL, EOMI, normal lids, iris ENT:  grossly normal hearing, lips & tongue, mmm; appropriate dentition Neck:  no LAD, masses or thyromegaly Cardiovascular:  RRR, no m/r/g. No LE edema.  Respiratory:   CTA bilaterally with no wheezes/rales/rhonchi.  Normal respiratory effort. Abdomen:  soft, NT, ND Skin:  no rash or induration seen on limited exam Musculoskeletal:  grossly normal tone BUE/BLE, good ROM, no bony abnormality Psychiatric:  grossly normal mood and affect, speech fluent and appropriate, AOx3 Neurologic:  CN 2-12 grossly intact, moves all extremities in coordinated fashion   Radiological Exams on Admission: Independently reviewed - see discussion in A/P where applicable  DG Chest 2 View  Result Date: 06/06/2021 CLINICAL DATA:  Chest pain.  Weakness. EXAM: CHEST - 2 VIEW COMPARISON:  08/15/2017 FINDINGS: Lung volumes are low.The cardiomediastinal contours are normal. Pulmonary vasculature is normal. No consolidation, pleural effusion, or pneumothorax. No acute osseous abnormalities are seen. IMPRESSION: Low lung volumes without acute abnormality. Electronically Signed   By: Narda RutherfordMelanie  Sanford M.D.   On: 06/06/2021 01:44    EKG: Independently reviewed.  NSR with rate 72; no evidence of acute ischemia   Labs on Admission: I have personally reviewed the available labs and imaging studies at the time of the admission.  Pertinent labs:    Glucose 150 BUN 10/Creatinine 1.09/GFR 58 HS troponin 16, 63, 85, 83 Normal CBC Lipids: 203/76/112/77   Assessment and Plan: Principal Problem:   NSTEMI (non-ST elevated myocardial infarction) (HCC) Active Problems:   Essential  hypertension   Hyperlipidemia    NSTEMI -Patient with substernal chest pain that came on acutely at rest and improved with NTG. -2/3 features suggestive of atypical angina -CXR unremarkable.   -Initial HS troponin negative; repeat with POSITIVE delta, suggestive of ischemia.   -EKG with no apparent STEMI. -TIMI risk score is 2; which predicts a 14 days risk of death, recurrent MI, or urgent revascularization of 8%.  -Will admit since the patient has positive troponins and angina necessitating acute intervention. -Risk factor stratification with HgbA1c and FLP; will also check TSH and UDS -Cardiology consultation requested -Patient appears likely to need invasive evaluation (cardiac catheterization) based on concerning history, elevated troponin  -Start ASA 81 mg daily -NTG for symptom relief (although there is no mortality benefit) -Will plan to start Heparin drip  -Supplemental O2 -Start Lipitor 80 mg daily for now -Hold estradiol  HTN -Continue lisinopril -Will also add prn hydralazine  HLD -Change Zocor to high-dose Lipitor  Advance Care Planning:   Code Status: Full Code   Consults: Cardiology  DVT Prophylaxis: Heparin drip  Family Communication: None present; I spoke with her daughter by telephone in the patient's room at the time of admission  Severity of Illness: The appropriate patient status for this patient is INPATIENT. Inpatient status is judged to be reasonable and necessary in order to provide the required intensity of service to ensure the patient's safety. The patient's presenting symptoms, physical exam findings, and initial radiographic and laboratory data in the context of their chronic comorbidities is felt to place them at high risk for further clinical deterioration. Furthermore, it is not anticipated that the patient will be medically stable for discharge from the hospital within 2 midnights of admission.   * I certify that at the point of  admission it is my clinical judgment that the patient will require inpatient hospital care spanning beyond 2 midnights from the point of admission due to high intensity of service, high risk for further deterioration and high frequency of surveillance required.*  Author: Jonah Blue, MD 06/06/2021 5:16 PM  For on call review [email or web address].

## 2021-06-06 NOTE — ED Provider Triage Note (Signed)
  Emergency Medicine Provider Triage Evaluation Note  MRN:  [id number]  Arrival date & time: 06/06/21    Medically screening exam initiated at 1:09 AM.   CC:   No chief complaint on file.   HPI:  Miranda Christensen is a 61 y.o. year-old female presents to the ED with chief complaint of chest pain that started tonight at 10:30pm.  It radiates to her arm and jaw.  Denies SOB. Was given 324mg  ASA and nitroglycerin by EMS.  Reports improvement, but not resolution of symptoms.  History provided by History provided by patient. ROS:  -As included in HPI PE:   Vitals:   06/06/21 0055  BP: (!) 123/91  Pulse: (!) 101  Resp: 17  Temp: 97.7 F (36.5 C)  SpO2: 100%    Non-toxic appearing No respiratory distress  MDM:  Based on signs and symptoms, ACS  is highest on my differential, followed by . I've ordered labs in triage to expedite lab/diagnostic workup.  Patient was informed that the remainder of the evaluation will be completed by another provider, this initial triage assessment does not replace that evaluation, and the importance of remaining in the ED until their evaluation is complete.    06/08/21, PA-C 06/06/21 0111

## 2021-06-06 NOTE — ED Notes (Signed)
Pt ambulatory to the bathroom 

## 2021-06-07 DIAGNOSIS — E785 Hyperlipidemia, unspecified: Secondary | ICD-10-CM

## 2021-06-07 DIAGNOSIS — I214 Non-ST elevation (NSTEMI) myocardial infarction: Secondary | ICD-10-CM | POA: Diagnosis not present

## 2021-06-07 DIAGNOSIS — I10 Essential (primary) hypertension: Secondary | ICD-10-CM | POA: Diagnosis not present

## 2021-06-07 LAB — HEPARIN LEVEL (UNFRACTIONATED)
Heparin Unfractionated: 0.3 IU/mL (ref 0.30–0.70)
Heparin Unfractionated: 0.4 IU/mL (ref 0.30–0.70)

## 2021-06-07 LAB — BASIC METABOLIC PANEL
Anion gap: 8 (ref 5–15)
BUN: 11 mg/dL (ref 6–20)
CO2: 23 mmol/L (ref 22–32)
Calcium: 8.9 mg/dL (ref 8.9–10.3)
Chloride: 107 mmol/L (ref 98–111)
Creatinine, Ser: 0.84 mg/dL (ref 0.44–1.00)
GFR, Estimated: >60 mL/min (ref >60)
Glucose, Bld: 99 mg/dL (ref 70–99)
Potassium: 3.8 mmol/L (ref 3.5–5.1)
Sodium: 138 mmol/L (ref 135–145)

## 2021-06-07 LAB — CBC
HCT: 39.2 % (ref 36.0–46.0)
Hemoglobin: 13.2 g/dL (ref 12.0–15.0)
MCH: 30.1 pg (ref 26.0–34.0)
MCHC: 33.7 g/dL (ref 30.0–36.0)
MCV: 89.3 fL (ref 80.0–100.0)
Platelets: 275 10*3/uL (ref 150–400)
RBC: 4.39 MIL/uL (ref 3.87–5.11)
RDW: 14.4 % (ref 11.5–15.5)
WBC: 8.2 10*3/uL (ref 4.0–10.5)
nRBC: 0 % (ref 0.0–0.2)

## 2021-06-07 MED ORDER — ASPIRIN 81 MG PO CHEW
81.0000 mg | CHEWABLE_TABLET | ORAL | Status: AC
  Administered 2021-06-08: 81 mg via ORAL
  Filled 2021-06-07: qty 1

## 2021-06-07 MED ORDER — SODIUM CHLORIDE 0.9% FLUSH: 3.0000 mL | INTRAVENOUS | Status: DC | PRN

## 2021-06-07 MED ORDER — METOPROLOL SUCCINATE ER 25 MG PO TB24
12.5000 mg | ORAL_TABLET | Freq: Every day | ORAL | Status: DC
  Administered 2021-06-07 – 2021-06-08 (×2): 12.5 mg via ORAL
  Filled 2021-06-07 (×2): qty 1

## 2021-06-07 MED ORDER — SODIUM CHLORIDE 0.9 % IV SOLN: 250.0000 mL | INTRAVENOUS | Status: DC | PRN

## 2021-06-07 MED ORDER — SODIUM CHLORIDE 0.9 % WEIGHT BASED INFUSION
3.0000 mL/kg/h | INTRAVENOUS | Status: DC
  Administered 2021-06-08: 3 mL/kg/h via INTRAVENOUS

## 2021-06-07 MED ORDER — SODIUM CHLORIDE 0.9 % WEIGHT BASED INFUSION: 1.0000 mL/kg/h | INTRAVENOUS | Status: DC

## 2021-06-07 MED ORDER — SODIUM CHLORIDE 0.9% FLUSH: 3.0000 mL | Freq: Two times a day (BID) | INTRAVENOUS | Status: DC

## 2021-06-07 NOTE — Progress Notes (Signed)
Progress Note  Patient Name: Miranda Christensen Date of Encounter: 06/07/2021  [facility] Cardiologist: None   Subjective   Denies any chest pain or dyspnea  Inpatient Medications    Scheduled Meds:  aspirin  81 mg Oral Daily   atorvastatin  80 mg Oral Daily   cycloSPORINE  1 drop Both Eyes BID   lisinopril  10 mg Oral Daily   metoprolol succinate  12.5 mg Oral Daily   montelukast  10 mg Oral q morning   zolpidem  5 mg Oral QHS   Continuous Infusions:  heparin 1,000 Units/hr (06/07/21 0705)   PRN Meds: acetaminophen **OR** acetaminophen, nitroGLYCERIN, ondansetron (ZOFRAN) IV   Vital Signs    Vitals:   06/06/21 2318 06/07/21 0541 06/07/21 0836 06/07/21 0905  BP: 118/86 116/73 116/76 116/76  Pulse: (!) 57 63  74  Resp: 18 18    Temp: 97.7 F (36.5 C) 97.7 F (36.5 C)    TempSrc: Oral Oral    SpO2: 99% 96%    Weight:  82.3 kg    Height:        Intake/Output Summary (Last 24 hours) at 06/07/2021 1043 Last data filed at 06/06/2021 1800 Gross per 24 hour  Intake 67.75 ml  Output 500 ml  Net -432.25 ml      06/07/2021    5:41 AM 06/06/2021    2:59 PM 06/06/2021   11:29 AM  Last 3 Weights  Weight (lbs) 181 lb 8 oz 182 lb 4.8 oz 186 lb 8.2 oz  Weight (kg) 82.328 kg 82.691 kg 84.6 kg      Telemetry    Normal sinus rhythm rate 50s to 80s- Personally Reviewed  ECG    No new ECG- Personally Reviewed  Physical Exam   GEN: No acute distress.   Neck: No JVD Cardiac: RRR, no murmurs, rubs, or gallops.  Respiratory: Clear to auscultation bilaterally. GI: Soft, nontender, non-distended  MS: No edema; No deformity. Neuro:  Nonfocal  Psych: Normal affect   Labs    High Sensitivity Troponin:   Recent Labs  Lab 06/06/21 0115 06/06/21 0328 06/06/21 0958 06/06/21 1255  TROPONINIHS 16 63* 85* 83*     Chemistry Recent Labs  Lab 06/06/21 0115 06/06/21 0958 06/07/21 0145  NA 137  --  138  K 4.5  --  3.8  CL 103  --  107  CO2 22  --  23  GLUCOSE  150*  --  99  BUN 10  --  11  CREATININE 1.09*  --  0.84  CALCIUM 9.0  --  8.9  MG  --  2.1  --   GFRNONAA 58*  --  >60  ANIONGAP 12  --  8    Lipids  Recent Labs  Lab 06/06/21 1255  CHOL 203*  TRIG 77  HDL 76  LDLCALC 112*  CHOLHDL 2.7    Hematology Recent Labs  Lab 06/06/21 0115 06/07/21 0611  WBC 9.7 8.2  RBC 4.72 4.39  HGB 13.9 13.2  HCT 42.1 39.2  MCV 89.2 89.3  MCH 29.4 30.1  MCHC 33.0 33.7  RDW 14.2 14.4  PLT 301 275   Thyroid  Recent Labs  Lab 06/06/21 1255  TSH 1.615    BNPNo results for input(s): BNP, PROBNP in the last 168 hours.  DDimer No results for input(s): DDIMER in the last 168 hours.   Radiology    DG Chest 2 View  Result Date: 06/06/2021 CLINICAL DATA:  Chest  pain.  Weakness. EXAM: CHEST - 2 VIEW COMPARISON:  08/15/2017 FINDINGS: Lung volumes are low.The cardiomediastinal contours are normal. Pulmonary vasculature is normal. No consolidation, pleural effusion, or pneumothorax. No acute osseous abnormalities are seen. IMPRESSION: Low lung volumes without acute abnormality. Electronically Signed   By: Narda Rutherford M.D.   On: 06/06/2021 01:44   ECHOCARDIOGRAM COMPLETE  Result Date: 06/06/2021    ECHOCARDIOGRAM REPORT   Patient Name:   DUSTINA SCOGGIN Date of Exam: 06/06/2021 Medical Rec #:  [id number]     Height:       63.0 in Accession #:    [id number]    Weight:       182.3 lb Date of Birth:  [birth year]     BSA:          1.859 m Patient Age:    61 years      BP:           127/80 mmHg Patient Gender: F             HR:           58 bpm. Exam Location:  Inpatient Procedure: 2D Echo Indications:    chest pain  History:        Patient has no prior history of Echocardiogram examinations.                 Risk Factors:Hypertension and Dyslipidemia.  Sonographer:    Delcie Roch RDCS Referring Phys: [id number] Little Ishikawa  Sonographer Comments: Image acquisition challenging due to respiratory motion. IMPRESSIONS  1. Left ventricular ejection  fraction, by estimation, is 60 to 65%. The left ventricle has normal function. The left ventricle has no regional wall motion abnormalities. Left ventricular diastolic parameters were normal.  2. Right ventricular systolic function is normal. The right ventricular size is normal. Tricuspid regurgitation signal is inadequate for assessing PA pressure.  3. The mitral valve is normal in structure. No evidence of mitral valve regurgitation. No evidence of mitral stenosis.  4. The aortic valve is tricuspid. Aortic valve regurgitation is trivial. No aortic stenosis is present.  5. The inferior vena cava is normal in size with greater than 50% respiratory variability, suggesting right atrial pressure of 3 mmHg. FINDINGS  Left Ventricle: Left ventricular ejection fraction, by estimation, is 60 to 65%. The left ventricle has normal function. The left ventricle has no regional wall motion abnormalities. The left ventricular internal cavity size was normal in size. There is  no left ventricular hypertrophy. Left ventricular diastolic parameters were normal. Right Ventricle: The right ventricular size is normal. No increase in right ventricular wall thickness. Right ventricular systolic function is normal. Tricuspid regurgitation signal is inadequate for assessing PA pressure. Left Atrium: Left atrial size was normal in size. Right Atrium: Right atrial size was normal in size. Pericardium: There is no evidence of pericardial effusion. Mitral Valve: The mitral valve is normal in structure. No evidence of mitral valve regurgitation. No evidence of mitral valve stenosis. Tricuspid Valve: The tricuspid valve is normal in structure. Tricuspid valve regurgitation is not demonstrated. Aortic Valve: The aortic valve is tricuspid. Aortic valve regurgitation is trivial. No aortic stenosis is present. Pulmonic Valve: The pulmonic valve was not well visualized. Pulmonic valve regurgitation is not visualized. Aorta: The aortic root and  ascending aorta are structurally normal, with no evidence of dilitation. Venous: The inferior vena cava is normal in size with greater than 50% respiratory variability, suggesting right atrial pressure of 3 mmHg. IAS/Shunts:  The interatrial septum was not well visualized.  LEFT VENTRICLE PLAX 2D LVIDd:         4.30 cm   Diastology LVIDs:         2.90 cm   LV e' medial:    10.70 cm/s LV PW:         1.00 cm   LV E/e' medial:  5.4 LV IVS:        0.90 cm   LV e' lateral:   12.50 cm/s LVOT diam:     1.90 cm   LV E/e' lateral: 4.6 LV SV:         46 LV SV Index:   25 LVOT Area:     2.84 cm  RIGHT VENTRICLE RV S prime:     10.40 cm/s TAPSE (M-mode): 2.4 cm LEFT ATRIUM             Index        RIGHT ATRIUM           Index LA diam:        3.60 cm 1.94 cm/m   RA Area:     11.90 cm LA Vol (A2C):   40.2 ml 21.62 ml/m  RA Volume:   27.60 ml  14.85 ml/m LA Vol (A4C):   37.7 ml 20.28 ml/m LA Biplane Vol: 39.2 ml 21.09 ml/m  AORTIC VALVE LVOT Vmax:   73.70 cm/s LVOT Vmean:  46.700 cm/s LVOT VTI:    0.161 m  AORTA Ao Root diam: 2.90 cm Ao Asc diam:  3.30 cm MITRAL VALVE MV Area (PHT): 4.49 cm    SHUNTS MV Decel Time: 169 msec    Systemic VTI:  0.16 m MV E velocity: 57.50 cm/s  Systemic Diam: 1.90 cm MV A velocity: 66.30 cm/s MV E/A ratio:  0.87 Epifanio Lesches MD Electronically signed by Epifanio Lesches MD Signature Date/Time: 06/06/2021/6:20:13 PM    Final     Cardiac Studies   Echo 06/06/2021:  1. Left ventricular ejection fraction, by estimation, is 60 to 65%. The  left ventricle has normal function. The left ventricle has no regional  wall motion abnormalities. Left ventricular diastolic parameters were  normal.   2. Right ventricular systolic function is normal. The right ventricular  size is normal. Tricuspid regurgitation signal is inadequate for assessing  PA pressure.   3. The mitral valve is normal in structure. No evidence of mitral valve  regurgitation. No evidence of mitral stenosis.   4.  The aortic valve is tricuspid. Aortic valve regurgitation is trivial.  No aortic stenosis is present.   5. The inferior vena cava is normal in size with greater than 50%  respiratory variability, suggesting right atrial pressure of 3 mmHg.   Patient Profile     61 y.o. female with a history of hypertension, hyperlipidemia, tobacco use who we are consulted by Dr. Ophelia Charter for evaluation of chest pain  Assessment & Plan    NSTEMI: Presented with chest pain 06/05/2021 night while watching TV, pain resolved with nitro.  EKG without acute ischemic change. Troponin 16 >63 >85>83.  Risk factors include hypertension, hyperlipidemia, former tobacco use.  Currently chest pain free.  Echo shows normal LV systolic function - Continue heparin gtt, ASA 81mg , Metporolol 25mg  XL  -Plan cardiac catheterization on Monday.  Risks and benefits of cardiac catheterization have been discussed with the patient.  These include bleeding, infection, kidney damage, stroke, heart attack, death.  The patient understands these risks and is willing to  proceed.   Hypertension: continue lisinopril, metoprolol added as above   Hyperlipidemia: On simvastatin 40 mg daily.  LDL 112.  Switch to atorvastatin 80 mg daily    For questions or updates, please contact [facility] Please consult [email or web address] for contact info under        Signed, Rishawn Walck L Kanya Potteiger, MD  06/07/2021, 10:43 AM    

## 2021-06-07 NOTE — Progress Notes (Signed)
PROGRESS NOTE    Miranda Christensen  [id number] DOB: 1960/08/01 DOA: 06/06/2021 PCP: Miranda Haber, MD   Brief Narrative:  Miranda Christensen is a 61 y.o. female with medical history significant of anxiety/depression; HTN; and HLD presenting with chest pain radiating to her neck/throat. Appears to be non-exertional. Improved with nitro. Cardiology consulted for further workup and evaluation.  Assessment & Plan:   Principal Problem:   NSTEMI (non-ST elevated myocardial infarction) [facility]) Active Problems:   Essential hypertension   Hyperlipidemia  Unstable angina, POA NSTEMI, rule out ACS - Troponin negative at intake -peak 85, reassuringly low - Cardiology following -appreciate insight and recommendations -Planned cardiac cath Monday the 22nd -Continue heparin drip; core measures including aspirin, atorvastatin, lisinopril, metoprolol  HTN, essential -Continue lisinopril, as needed hydralazine   HLD -Continue atorvastatin (previously on simvastatin)  DVT prophylaxis: Heparin drip Code Status: Full Family Communication: None present  Status is: Inpatient  Dispo: The patient is from: Home              Anticipated d/c is to: Home              Anticipated d/c date is: 24 to 48 hours              Patient currently not medically stable for discharge  Consultants:  Cardiology  Procedures:  Cath pending 06/08/2021  Antimicrobials:  None  Subjective: No acute issues or events overnight denies further chest pain denies nausea vomiting diarrhea constipation headache fevers chills or shortness of breath  Objective: Vitals:   06/06/21 1459 06/06/21 1947 06/06/21 2318 06/07/21 0541  BP: 127/80 121/77 118/86 116/73  Pulse: (!) 56 60 (!) 57 63  Resp: 15 17 18 18   Temp: 97.8 F (36.6 C) 98.2 F (36.8 C) 97.7 F (36.5 C) 97.7 F (36.5 C)  TempSrc: Oral Oral Oral Oral  SpO2: 100% 100% 99% 96%  Weight: 82.7 kg   82.3 kg  Height: 5\' 3"  (1.6 m)       Intake/Output Summary  (Last 24 hours) at 06/07/2021 0725 Last data filed at 06/06/2021 1800 Gross per 24 hour  Intake 67.75 ml  Output 500 ml  Net -432.25 ml   Filed Weights   06/06/21 1129 06/06/21 1459 06/07/21 0541  Weight: 84.6 kg 82.7 kg 82.3 kg    Examination:  General exam: Appears calm and comfortable  Respiratory system: Clear to auscultation. Respiratory effort normal. Cardiovascular system: S1 & S2 heard, RRR. No JVD, murmurs, rubs, gallops or clicks. No pedal edema. Gastrointestinal system: Abdomen is nondistended, soft and nontender. No organomegaly or masses felt. Normal bowel sounds heard. Central nervous system: Alert and oriented. No focal neurological deficits. Extremities: Symmetric 5 x 5 power. Skin: No rashes, lesions or ulcers Psychiatry: Judgement and insight appear normal. Mood & affect appropriate.     Data Reviewed: I have personally reviewed following labs and imaging studies  CBC: Recent Labs  Lab 06/06/21 0115  WBC 9.7  HGB 13.9  HCT 42.1  MCV 89.2  PLT [id number]   Basic Metabolic Panel: Recent Labs  Lab 06/06/21 0115 06/06/21 0958 06/07/21 0145  NA 137  --  138  K 4.5  --  3.8  CL 103  --  107  CO2 22  --  23  GLUCOSE 150*  --  99  BUN 10  --  11  CREATININE 1.09*  --  0.84  CALCIUM 9.0  --  8.9  MG  --  2.1  --  GFR: Estimated Creatinine Clearance: 72.4 mL/min (by C-G formula based on SCr of 0.84 mg/dL). Liver Function Tests: No results for input(s): AST, ALT, ALKPHOS, BILITOT, PROT, ALBUMIN in the last 168 hours. No results for input(s): LIPASE, AMYLASE in the last 168 hours. No results for input(s): AMMONIA in the last 168 hours. Coagulation Profile: Recent Labs  Lab 06/06/21 1255  INR 0.9   Cardiac Enzymes: No results for input(s): CKTOTAL, CKMB, CKMBINDEX, TROPONINI in the last 168 hours. BNP (last 3 results) No results for input(s): PROBNP in the last 8760 hours. HbA1C: Recent Labs    06/06/21 1255  HGBA1C 5.9*   CBG: No results for  input(s): GLUCAP in the last 168 hours. Lipid Profile: Recent Labs    06/06/21 1255  CHOL 203*  HDL 76  LDLCALC 112*  TRIG 77  CHOLHDL 2.7   Thyroid Function Tests: Recent Labs    06/06/21 1255  TSH 1.615   Anemia Panel: No results for input(s): VITAMINB12, FOLATE, FERRITIN, TIBC, IRON, RETICCTPCT in the last 72 hours. Sepsis Labs: No results for input(s): PROCALCITON, LATICACIDVEN in the last 168 hours.  No results found for this or any previous visit (from the past 240 hour(s)).       Radiology Studies: DG Chest 2 View  Result Date: 06/06/2021 CLINICAL DATA:  Chest pain.  Weakness. EXAM: CHEST - 2 VIEW COMPARISON:  08/15/2017 FINDINGS: Lung volumes are low.The cardiomediastinal contours are normal. Pulmonary vasculature is normal. No consolidation, pleural effusion, or pneumothorax. No acute osseous abnormalities are seen. IMPRESSION: Low lung volumes without acute abnormality. Electronically Signed   By: Miranda Christensen M.D.   On: 06/06/2021 01:44   ECHOCARDIOGRAM COMPLETE  Result Date: 06/06/2021    ECHOCARDIOGRAM REPORT   Patient Name:   Miranda Christensen Date of Exam: 06/06/2021 Medical Rec #:  [id number]     Height:       63.0 in Accession #:    [id number]    Weight:       182.3 lb Date of Birth:  11/10/1960     BSA:          1.859 m Patient Age:    58 years      BP:           127/80 mmHg Patient Gender: F             HR:           58 bpm. Exam Location:  Inpatient Procedure: 2D Echo Indications:    chest pain  History:        Patient has no prior history of Echocardiogram examinations.                 Risk Factors:Hypertension and Dyslipidemia.  Sonographer:    Miranda Christensen RDCS Referring Phys: [id number] Donato Heinz  Sonographer Comments: Image acquisition challenging due to respiratory motion. IMPRESSIONS  1. Left ventricular ejection fraction, by estimation, is 60 to 65%. The left ventricle has normal function. The left ventricle has no regional wall motion  abnormalities. Left ventricular diastolic parameters were normal.  2. Right ventricular systolic function is normal. The right ventricular size is normal. Tricuspid regurgitation signal is inadequate for assessing PA pressure.  3. The mitral valve is normal in structure. No evidence of mitral valve regurgitation. No evidence of mitral stenosis.  4. The aortic valve is tricuspid. Aortic valve regurgitation is trivial. No aortic stenosis is present.  5. The inferior vena cava is normal in size with  greater than 50% respiratory variability, suggesting right atrial pressure of 3 mmHg. FINDINGS  Left Ventricle: Left ventricular ejection fraction, by estimation, is 60 to 65%. The left ventricle has normal function. The left ventricle has no regional wall motion abnormalities. The left ventricular internal cavity size was normal in size. There is  no left ventricular hypertrophy. Left ventricular diastolic parameters were normal. Right Ventricle: The right ventricular size is normal. No increase in right ventricular wall thickness. Right ventricular systolic function is normal. Tricuspid regurgitation signal is inadequate for assessing PA pressure. Left Atrium: Left atrial size was normal in size. Right Atrium: Right atrial size was normal in size. Pericardium: There is no evidence of pericardial effusion. Mitral Valve: The mitral valve is normal in structure. No evidence of mitral valve regurgitation. No evidence of mitral valve stenosis. Tricuspid Valve: The tricuspid valve is normal in structure. Tricuspid valve regurgitation is not demonstrated. Aortic Valve: The aortic valve is tricuspid. Aortic valve regurgitation is trivial. No aortic stenosis is present. Pulmonic Valve: The pulmonic valve was not well visualized. Pulmonic valve regurgitation is not visualized. Aorta: The aortic root and ascending aorta are structurally normal, with no evidence of dilitation. Venous: The inferior vena cava is normal in size with  greater than 50% respiratory variability, suggesting right atrial pressure of 3 mmHg. IAS/Shunts: The interatrial septum was not well visualized.  LEFT VENTRICLE PLAX 2D LVIDd:         4.30 cm   Diastology LVIDs:         2.90 cm   LV e' medial:    10.70 cm/s LV PW:         1.00 cm   LV E/e' medial:  5.4 LV IVS:        0.90 cm   LV e' lateral:   12.50 cm/s LVOT diam:     1.90 cm   LV E/e' lateral: 4.6 LV SV:         46 LV SV Index:   25 LVOT Area:     2.84 cm  RIGHT VENTRICLE RV S prime:     10.40 cm/s TAPSE (M-mode): 2.4 cm LEFT ATRIUM             Index        RIGHT ATRIUM           Index LA diam:        3.60 cm 1.94 cm/m   RA Area:     11.90 cm LA Vol (A2C):   40.2 ml 21.62 ml/m  RA Volume:   27.60 ml  14.85 ml/m LA Vol (A4C):   37.7 ml 20.28 ml/m LA Biplane Vol: 39.2 ml 21.09 ml/m  AORTIC VALVE LVOT Vmax:   73.70 cm/s LVOT Vmean:  46.700 cm/s LVOT VTI:    0.161 m  AORTA Ao Root diam: 2.90 cm Ao Asc diam:  3.30 cm MITRAL VALVE MV Area (PHT): 4.49 cm    SHUNTS MV Decel Time: 169 msec    Systemic VTI:  0.16 m MV E velocity: 57.50 cm/s  Systemic Diam: 1.90 cm MV A velocity: 66.30 cm/s MV E/A ratio:  0.87 Oswaldo Milian MD Electronically signed by Oswaldo Milian MD Signature Date/Time: 06/06/2021/6:20:13 PM    Final     Scheduled Meds:  aspirin  81 mg Oral Daily   atorvastatin  80 mg Oral Daily   cycloSPORINE  1 drop Both Eyes BID   lisinopril  10 mg Oral Daily   metoprolol succinate  25  mg Oral Daily   montelukast  10 mg Oral q morning   zolpidem  5 mg Oral QHS   Continuous Infusions:  heparin 1,000 Units/hr (06/07/21 0705)     LOS: 1 day   Time spent: 33min  Jarom Govan C Treniece Holsclaw, DO [facility]  If 7PM-7AM, please contact night-coverage [email or web address]  06/07/2021, 7:25 AM

## 2021-06-07 NOTE — Progress Notes (Signed)
ANTICOAGULATION CONSULT NOTE - Follow Up Consult  Pharmacy Consult for Heparin Indication: chest pain/ACS  Allergies  Allergen Reactions   Naproxen Nausea And Vomiting    Patient Measurements: Height: 5\' 3"  (160 cm) Weight: 82.3 kg (181 lb 8 oz) IBW/kg (Calculated) : 52.4 Heparin Dosing Weight: 70.7 kg  Vital Signs: Temp: 97.7 F (36.5 C) (05/21 0541) Temp Source: Oral (05/21 0541) BP: 116/73 (05/21 0541) Pulse Rate: 63 (05/21 0541)  Labs: Recent Labs    06/06/21 0115 06/06/21 0328 06/06/21 0958 06/06/21 1255 06/06/21 1747 06/07/21 0145 06/07/21 0611  HGB 13.9  --   --   --   --   --  13.2  HCT 42.1  --   --   --   --   --  39.2  PLT 301  --   --   --   --   --  275  LABPROT  --   --   --  12.3  --   --   --   INR  --   --   --  0.9  --   --   --   HEPARINUNFRC  --   --   --   --  0.15* 0.30 0.40  CREATININE 1.09*  --   --   --   --  0.84  --   TROPONINIHS 16 63* 85* 83*  --   --   --     Estimated Creatinine Clearance: 72.4 mL/min (by C-G formula based on SCr of 0.84 mg/dL).   Medications:  Scheduled:   aspirin  81 mg Oral Daily   atorvastatin  80 mg Oral Daily   cycloSPORINE  1 drop Both Eyes BID   lisinopril  10 mg Oral Daily   metoprolol succinate  25 mg Oral Daily   montelukast  10 mg Oral q morning   zolpidem  5 mg Oral QHS   Infusions:   heparin 1,000 Units/hr (06/07/21 06/09/21)    Assessment: 61 yo F with a history of anxiety with depression, HTN, HLD, GERD. Patient is presenting with chest pain. Heparin per pharmacy consult placed for chest pain/ACS. Patient is not on anticoagulation prior to arrival.  Heparin level today is therapeutic at 0.40, on 1000 units/hr. Hgb 13.2, plt 275. No line issues or signs/symptoms of bleeding noted per RN.  Goal of Therapy:  Heparin level 0.3-0.7 units/ml Monitor platelets by anticoagulation protocol: Yes   Plan:  Continue heparin infusion at 1000 units/hr. Daily CBC, heparin level. Monitor for  signs/symptoms of bleeding.   68, PharmD PGY1 Pharmacy Resident Phone [phone] 06/07/2021 8:32 AM   Please check AMION for all [facility] Pharmacy phone numbers After 10:00 PM, call Main Pharmacy [phone]

## 2021-06-07 NOTE — Plan of Care (Signed)
  Problem: Activity: Goal: Risk for activity intolerance will decrease Outcome: Progressing   Problem: Health Behavior/Discharge Planning: Goal: Ability to manage health-related needs will improve Outcome: Completed/Met   Problem: Clinical Measurements: Goal: Ability to maintain clinical measurements within normal limits will improve Outcome: Completed/Met   Problem: Nutrition: Goal: Adequate nutrition will be maintained Outcome: Completed/Met   Problem: Coping: Goal: Level of anxiety will decrease Outcome: Completed/Met   Problem: Elimination: Goal: Will not experience complications related to urinary retention Outcome: Completed/Met   Problem: Pain Managment: Goal: General experience of comfort will improve Outcome: Completed/Met

## 2021-06-07 NOTE — Progress Notes (Signed)
  Echocardiogram 2D Echocardiogram has been performed.  Miranda Christensen 06/07/2021, 7:42 AM

## 2021-06-07 NOTE — H&P (View-Only) (Signed)
Progress Note  Patient Name: Miranda Christensen Date of Encounter: 06/07/2021  [facility]: None   Subjective   Denies any chest pain or dyspnea  Inpatient Medications    Scheduled Meds:  aspirin  81 mg Oral Daily   atorvastatin  80 mg Oral Daily   cycloSPORINE  1 drop Both Eyes BID   lisinopril  10 mg Oral Daily   metoprolol succinate  12.5 mg Oral Daily   montelukast  10 mg Oral q morning   zolpidem  5 mg Oral QHS   Continuous Infusions:  heparin 1,000 Units/hr (06/07/21 0705)   PRN Meds: acetaminophen **OR** acetaminophen, nitroGLYCERIN, ondansetron (ZOFRAN) IV   Vital Signs    Vitals:   06/06/21 2318 06/07/21 0541 06/07/21 0836 06/07/21 0905  BP: 118/86 116/73 116/76 116/76  Pulse: (!) 57 63  74  Resp: 18 18    Temp: 97.7 F (36.5 C) 97.7 F (36.5 C)    TempSrc: Oral Oral    SpO2: 99% 96%    Weight:  82.3 kg    Height:        Intake/Output Summary (Last 24 hours) at 06/07/2021 1043 Last data filed at 06/06/2021 1800 Gross per 24 hour  Intake 67.75 ml  Output 500 ml  Net -432.25 ml      06/07/2021    5:41 AM 06/06/2021    2:59 PM 06/06/2021   11:29 AM  Last 3 Weights  Weight (lbs) 181 lb 8 oz 182 lb 4.8 oz 186 lb 8.2 oz  Weight (kg) 82.328 kg 82.691 kg 84.6 kg      Telemetry    Normal sinus rhythm rate 50s to 80s- Personally Reviewed  ECG    No new ECG- Personally Reviewed  Physical Exam   GEN: No acute distress.   Neck: No JVD Cardiac: RRR, no murmurs, rubs, or gallops.  Respiratory: Clear to auscultation bilaterally. GI: Soft, nontender, non-distended  MS: No edema; No deformity. Neuro:  Nonfocal  Psych: Normal affect   Labs    High Sensitivity Troponin:   Recent Labs  Lab 06/06/21 0115 06/06/21 0328 06/06/21 0958 06/06/21 1255  TROPONINIHS 16 63* 85* 83*     Chemistry Recent Labs  Lab 06/06/21 0115 06/06/21 0958 06/07/21 0145  NA 137  --  138  K 4.5  --  3.8  CL 103  --  107  CO2 22  --  23  GLUCOSE  150*  --  99  BUN 10  --  11  CREATININE 1.09*  --  0.84  CALCIUM 9.0  --  8.9  MG  --  2.1  --   GFRNONAA 58*  --  >60  ANIONGAP 12  --  8    Lipids  Recent Labs  Lab 06/06/21 1255  CHOL 203*  TRIG 77  HDL 76  LDLCALC 112*  CHOLHDL 2.7    Hematology Recent Labs  Lab 06/06/21 0115 06/07/21 0611  WBC 9.7 8.2  RBC 4.72 4.39  HGB 13.9 13.2  HCT 42.1 39.2  MCV 89.2 89.3  MCH 29.4 30.1  MCHC 33.0 33.7  RDW 14.2 14.4  PLT 301 275   Thyroid  Recent Labs  Lab 06/06/21 1255  TSH 1.615    BNPNo results for input(s): BNP, PROBNP in the last 168 hours.  DDimer No results for input(s): DDIMER in the last 168 hours.   Radiology    DG Chest 2 View  Result Date: 06/06/2021 CLINICAL DATA:  Chest  pain.  Weakness. EXAM: CHEST - 2 VIEW COMPARISON:  08/15/2017 FINDINGS: Lung volumes are low.The cardiomediastinal contours are normal. Pulmonary vasculature is normal. No consolidation, pleural effusion, or pneumothorax. No acute osseous abnormalities are seen. IMPRESSION: Low lung volumes without acute abnormality. Electronically Signed   By: Narda Rutherford M.D.   On: 06/06/2021 01:44   ECHOCARDIOGRAM COMPLETE  Result Date: 06/06/2021    ECHOCARDIOGRAM REPORT   Patient Name:   Miranda Christensen Date of Exam: 06/06/2021 Medical Rec #:  [id number]     Height:       63.0 in Accession #:    [id number]    Weight:       182.3 lb Date of Birth:  [birth year]     BSA:          1.859 m Patient Age:    61 years      BP:           127/80 mmHg Patient Gender: F             HR:           58 bpm. Exam Location:  Inpatient Procedure: 2D Echo Indications:    chest pain  History:        Patient has no prior history of Echocardiogram examinations.                 Risk Factors:Hypertension and Dyslipidemia.  Sonographer:    Delcie Roch RDCS Referring Phys: [id number] Little Ishikawa  Sonographer Comments: Image acquisition challenging due to respiratory motion. IMPRESSIONS  1. Left ventricular ejection  fraction, by estimation, is 60 to 65%. The left ventricle has normal function. The left ventricle has no regional wall motion abnormalities. Left ventricular diastolic parameters were normal.  2. Right ventricular systolic function is normal. The right ventricular size is normal. Tricuspid regurgitation signal is inadequate for assessing PA pressure.  3. The mitral valve is normal in structure. No evidence of mitral valve regurgitation. No evidence of mitral stenosis.  4. The aortic valve is tricuspid. Aortic valve regurgitation is trivial. No aortic stenosis is present.  5. The inferior vena cava is normal in size with greater than 50% respiratory variability, suggesting right atrial pressure of 3 mmHg. FINDINGS  Left Ventricle: Left ventricular ejection fraction, by estimation, is 60 to 65%. The left ventricle has normal function. The left ventricle has no regional wall motion abnormalities. The left ventricular internal cavity size was normal in size. There is  no left ventricular hypertrophy. Left ventricular diastolic parameters were normal. Right Ventricle: The right ventricular size is normal. No increase in right ventricular wall thickness. Right ventricular systolic function is normal. Tricuspid regurgitation signal is inadequate for assessing PA pressure. Left Atrium: Left atrial size was normal in size. Right Atrium: Right atrial size was normal in size. Pericardium: There is no evidence of pericardial effusion. Mitral Valve: The mitral valve is normal in structure. No evidence of mitral valve regurgitation. No evidence of mitral valve stenosis. Tricuspid Valve: The tricuspid valve is normal in structure. Tricuspid valve regurgitation is not demonstrated. Aortic Valve: The aortic valve is tricuspid. Aortic valve regurgitation is trivial. No aortic stenosis is present. Pulmonic Valve: The pulmonic valve was not well visualized. Pulmonic valve regurgitation is not visualized. Aorta: The aortic root and  ascending aorta are structurally normal, with no evidence of dilitation. Venous: The inferior vena cava is normal in size with greater than 50% respiratory variability, suggesting right atrial pressure of 3 mmHg. IAS/Shunts:  The interatrial septum was not well visualized.  LEFT VENTRICLE PLAX 2D LVIDd:         4.30 cm   Diastology LVIDs:         2.90 cm   LV e' medial:    10.70 cm/s LV PW:         1.00 cm   LV E/e' medial:  5.4 LV IVS:        0.90 cm   LV e' lateral:   12.50 cm/s LVOT diam:     1.90 cm   LV E/e' lateral: 4.6 LV SV:         46 LV SV Index:   25 LVOT Area:     2.84 cm  RIGHT VENTRICLE RV S prime:     10.40 cm/s TAPSE (M-mode): 2.4 cm LEFT ATRIUM             Index        RIGHT ATRIUM           Index LA diam:        3.60 cm 1.94 cm/m   RA Area:     11.90 cm LA Vol (A2C):   40.2 ml 21.62 ml/m  RA Volume:   27.60 ml  14.85 ml/m LA Vol (A4C):   37.7 ml 20.28 ml/m LA Biplane Vol: 39.2 ml 21.09 ml/m  AORTIC VALVE LVOT Vmax:   73.70 cm/s LVOT Vmean:  46.700 cm/s LVOT VTI:    0.161 m  AORTA Ao Root diam: 2.90 cm Ao Asc diam:  3.30 cm MITRAL VALVE MV Area (PHT): 4.49 cm    SHUNTS MV Decel Time: 169 msec    Systemic VTI:  0.16 m MV E velocity: 57.50 cm/s  Systemic Diam: 1.90 cm MV A velocity: 66.30 cm/s MV E/A ratio:  0.87 Epifanio Lesches MD Electronically signed by Epifanio Lesches MD Signature Date/Time: 06/06/2021/6:20:13 PM    Final     Cardiac Studies   Echo 06/06/2021:  1. Left ventricular ejection fraction, by estimation, is 60 to 65%. The  left ventricle has normal function. The left ventricle has no regional  wall motion abnormalities. Left ventricular diastolic parameters were  normal.   2. Right ventricular systolic function is normal. The right ventricular  size is normal. Tricuspid regurgitation signal is inadequate for assessing  PA pressure.   3. The mitral valve is normal in structure. No evidence of mitral valve  regurgitation. No evidence of mitral stenosis.   4.  The aortic valve is tricuspid. Aortic valve regurgitation is trivial.  No aortic stenosis is present.   5. The inferior vena cava is normal in size with greater than 50%  respiratory variability, suggesting right atrial pressure of 3 mmHg.   Patient Profile     61 y.o. female with a history of hypertension, hyperlipidemia, tobacco use who we are consulted by Dr. Ophelia Charter for evaluation of chest pain  Assessment & Plan    NSTEMI: Presented with chest pain 06/05/2021 night while watching TV, pain resolved with nitro.  EKG without acute ischemic change. Troponin 16 >63 >85>83.  Risk factors include hypertension, hyperlipidemia, former tobacco use.  Currently chest pain free.  Echo shows normal LV systolic function - Continue heparin gtt, ASA 81mg , Metporolol 25mg  XL  -Plan cardiac catheterization on Monday.  Risks and benefits of cardiac catheterization have been discussed with the patient.  These include bleeding, infection, kidney damage, stroke, heart attack, death.  The patient understands these risks and is willing to  proceed.   Hypertension: continue lisinopril, metoprolol added as above   Hyperlipidemia: On simvastatin 40 mg daily.  LDL 112.  Switch to atorvastatin 80 mg daily    For questions or updates, please contact [facility] Please consult [email or web address] for contact info under        Signed, Little Ishikawa, MD  06/07/2021, 10:43 AM

## 2021-06-08 ENCOUNTER — Encounter (HOSPITAL_COMMUNITY): Payer: Self-pay | Admitting: Internal Medicine

## 2021-06-08 ENCOUNTER — Encounter (HOSPITAL_COMMUNITY)
Admission: EM | Disposition: A | Discharge status: Home / Self Care | Payer: Self-pay | Source: Home / Self Care | Attending: Internal Medicine

## 2021-06-08 DIAGNOSIS — I214 Non-ST elevation (NSTEMI) myocardial infarction: Secondary | ICD-10-CM | POA: Diagnosis not present

## 2021-06-08 HISTORY — PX: LEFT HEART CATH AND CORONARY ANGIOGRAPHY: CATH118249

## 2021-06-08 LAB — CBC
HCT: 38.4 % (ref 36.0–46.0)
Hemoglobin: 12.9 g/dL (ref 12.0–15.0)
MCH: 30.1 pg (ref 26.0–34.0)
MCHC: 33.6 g/dL (ref 30.0–36.0)
MCV: 89.7 fL (ref 80.0–100.0)
Platelets: 265 10*3/uL (ref 150–400)
RBC: 4.28 MIL/uL (ref 3.87–5.11)
RDW: 14.2 % (ref 11.5–15.5)
WBC: 8.5 10*3/uL (ref 4.0–10.5)
nRBC: 0 % (ref 0.0–0.2)

## 2021-06-08 LAB — BASIC METABOLIC PANEL
Anion gap: 5 (ref 5–15)
BUN: 15 mg/dL (ref 6–20)
CO2: 27 mmol/L (ref 22–32)
Calcium: 8.6 mg/dL — ABNORMAL LOW (ref 8.9–10.3)
Chloride: 108 mmol/L (ref 98–111)
Creatinine, Ser: 1.01 mg/dL — ABNORMAL HIGH (ref 0.44–1.00)
GFR, Estimated: >60 mL/min (ref >60)
Glucose, Bld: 95 mg/dL (ref 70–99)
Potassium: 4.4 mmol/L (ref 3.5–5.1)
Sodium: 140 mmol/L (ref 135–145)

## 2021-06-08 LAB — HEPARIN LEVEL (UNFRACTIONATED): Heparin Unfractionated: 0.35 IU/mL (ref 0.30–0.70)

## 2021-06-08 SURGERY — LEFT HEART CATH AND CORONARY ANGIOGRAPHY
Anesthesia: LOCAL

## 2021-06-08 MED ORDER — HEPARIN (PORCINE) IN NACL 1000-0.9 UT/500ML-% IV SOLN
INTRAVENOUS | Status: AC
  Filled 2021-06-08: qty 1000

## 2021-06-08 MED ORDER — MIDAZOLAM HCL 2 MG/2ML IJ SOLN
INTRAMUSCULAR | Status: DC | PRN
  Administered 2021-06-08 (×2): 1 mg via INTRAVENOUS

## 2021-06-08 MED ORDER — SODIUM CHLORIDE 0.9% FLUSH: 3.0000 mL | INTRAVENOUS | Status: DC | PRN

## 2021-06-08 MED ORDER — HEPARIN (PORCINE) IN NACL 1000-0.9 UT/500ML-% IV SOLN
INTRAVENOUS | Status: DC | PRN
  Administered 2021-06-08 (×2): 500 mL

## 2021-06-08 MED ORDER — VERAPAMIL HCL 2.5 MG/ML IV SOLN
INTRAVENOUS | Status: AC
  Filled 2021-06-08: qty 2

## 2021-06-08 MED ORDER — HEPARIN SODIUM (PORCINE) 1000 UNIT/ML IJ SOLN
INTRAMUSCULAR | Status: AC
  Filled 2021-06-08: qty 10

## 2021-06-08 MED ORDER — LIDOCAINE HCL (PF) 1 % IJ SOLN
INTRAMUSCULAR | Status: DC | PRN
  Administered 2021-06-08: 2 mL

## 2021-06-08 MED ORDER — MIDAZOLAM HCL 2 MG/2ML IJ SOLN
INTRAMUSCULAR | Status: AC
  Filled 2021-06-08: qty 2

## 2021-06-08 MED ORDER — ATORVASTATIN CALCIUM 10 MG PO TABS: 20.0000 mg | ORAL_TABLET | Freq: Every day | ORAL | Status: DC

## 2021-06-08 MED ORDER — ASPIRIN 81 MG PO CHEW: 81.0000 mg | CHEWABLE_TABLET | Freq: Every day | ORAL | 0 refills | Status: AC

## 2021-06-08 MED ORDER — METOPROLOL SUCCINATE ER 25 MG PO TB24: 12.5000 mg | ORAL_TABLET | Freq: Every day | ORAL | 0 refills | Status: DC

## 2021-06-08 MED ORDER — VERAPAMIL HCL 2.5 MG/ML IV SOLN
INTRAVENOUS | Status: DC | PRN
  Administered 2021-06-08: 10 mL via INTRA_ARTERIAL

## 2021-06-08 MED ORDER — SODIUM CHLORIDE 0.9% FLUSH: 3.0000 mL | Freq: Two times a day (BID) | INTRAVENOUS | Status: DC

## 2021-06-08 MED ORDER — SODIUM CHLORIDE 0.9 % IV SOLN: INTRAVENOUS | Status: AC

## 2021-06-08 MED ORDER — HYDRALAZINE HCL 20 MG/ML IJ SOLN: 10.0000 mg | INTRAMUSCULAR | Status: DC | PRN

## 2021-06-08 MED ORDER — FENTANYL CITRATE (PF) 100 MCG/2ML IJ SOLN
INTRAMUSCULAR | Status: AC
Start: 2021-06-08 — End: ?
  Filled 2021-06-08: qty 2

## 2021-06-08 MED ORDER — SODIUM CHLORIDE 0.9 % IV SOLN: 250.0000 mL | INTRAVENOUS | Status: DC | PRN

## 2021-06-08 MED ORDER — LABETALOL HCL 5 MG/ML IV SOLN: 10.0000 mg | INTRAVENOUS | Status: DC | PRN

## 2021-06-08 MED ORDER — ATORVASTATIN CALCIUM 20 MG PO TABS: 20.0000 mg | ORAL_TABLET | Freq: Every day | ORAL | 0 refills | Status: DC

## 2021-06-08 MED ORDER — IOHEXOL 350 MG/ML SOLN
INTRAVENOUS | Status: DC | PRN
  Administered 2021-06-08: 20 mL

## 2021-06-08 MED ORDER — LIDOCAINE HCL (PF) 1 % IJ SOLN
INTRAMUSCULAR | Status: AC
  Filled 2021-06-08: qty 30

## 2021-06-08 MED ORDER — FENTANYL CITRATE (PF) 100 MCG/2ML IJ SOLN
INTRAMUSCULAR | Status: DC | PRN
  Administered 2021-06-08: 50 ug via INTRAVENOUS

## 2021-06-08 MED ORDER — HEPARIN SODIUM (PORCINE) 1000 UNIT/ML IJ SOLN
INTRAMUSCULAR | Status: DC | PRN
  Administered 2021-06-08: 4000 [IU] via INTRAVENOUS

## 2021-06-08 MED ORDER — ENOXAPARIN SODIUM 40 MG/0.4ML IJ SOSY: 40.0000 mg | PREFILLED_SYRINGE | INTRAMUSCULAR | Status: DC

## 2021-06-08 SURGICAL SUPPLY — 10 items
BAND ZEPHYR COMPRESS 30 LONG (HEMOSTASIS) ×1 IMPLANT
CATH 5FR JL3.5 JR4 ANG PIG MP (CATHETERS) ×1 IMPLANT
GLIDESHEATH SLEND SS 6F .021 (SHEATH) ×1 IMPLANT
GUIDEWIRE INQWIRE 1.5J.035X260 (WIRE) IMPLANT
INQWIRE 1.5J .035X260CM (WIRE) ×2
KIT HEART LEFT (KITS) ×2 IMPLANT
PACK CARDIAC CATHETERIZATION (CUSTOM PROCEDURE TRAY) ×2 IMPLANT
SHEATH PROBE COVER 6X72 (BAG) ×1 IMPLANT
TRANSDUCER W/STOPCOCK (MISCELLANEOUS) ×2 IMPLANT
TUBING CIL FLEX 10 FLL-RA (TUBING) ×2 IMPLANT

## 2021-06-08 NOTE — Progress Notes (Signed)
Radial site clean, dry, and intact. Site stable.

## 2021-06-08 NOTE — Progress Notes (Signed)
ANTICOAGULATION CONSULT NOTE - Follow Up Consult  Pharmacy Consult for Heparin Indication: chest pain/ACS  Allergies  Allergen Reactions   Naproxen Nausea And Vomiting    Patient Measurements: Height: 5\' 3"  (160 cm) Weight: 82.3 kg (181 lb 8 oz) IBW/kg (Calculated) : 52.4 Heparin Dosing Weight: 70.7 kg  Vital Signs: BP: 134/93 (05/22 1014) Pulse Rate: 68 (05/22 1014)  Labs: Recent Labs    06/06/21 0115 06/06/21 0328 06/06/21 0958 06/06/21 1255 06/06/21 1747 06/07/21 0145 06/07/21 0611 06/08/21 0635  HGB 13.9  --   --   --   --   --  13.2 12.9  HCT 42.1  --   --   --   --   --  39.2 38.4  PLT 301  --   --   --   --   --  275 265  LABPROT  --   --   --  12.3  --   --   --   --   INR  --   --   --  0.9  --   --   --   --   HEPARINUNFRC  --   --   --   --    < > 0.30 0.40 0.35  CREATININE 1.09*  --   --   --   --  0.84  --  1.01*  TROPONINIHS 16 63* 85* 83*  --   --   --   --    < > = values in this interval not displayed.     Estimated Creatinine Clearance: 60.2 mL/min (A) (by C-G formula based on SCr of 1.01 mg/dL (H)).   Medications:  Scheduled:   aspirin  81 mg Oral Daily   atorvastatin  80 mg Oral Daily   cycloSPORINE  1 drop Both Eyes BID   lisinopril  10 mg Oral Daily   metoprolol succinate  12.5 mg Oral Daily   montelukast  10 mg Oral q morning   sodium chloride flush  3 mL Intravenous Q12H   zolpidem  5 mg Oral QHS   Infusions:   sodium chloride     sodium chloride 1 mL/kg/hr (06/08/21 [id number])   heparin Stopped (06/08/21 1029)    Assessment: 61 yo F with a history of anxiety with depression, HTN, HLD, GERD. Patient is presenting with chest pain. Heparin per pharmacy consult placed for chest pain/ACS. Patient is not on anticoagulation prior to arrival.  Heparin level today is therapeutic at 0.35, on 1000 units/hr.   Goal of Therapy:  Heparin level 0.3-0.7 units/ml Monitor platelets by anticoagulation protocol: Yes   Plan:  Continue heparin  infusion at 1000 units/hr. Will follow plans s/p cath today   Hildred Laser, PharmD Clinical Pharmacist **Pharmacist phone directory can now be found on [email or web address] ([email or web address]).  Listed under [place].

## 2021-06-08 NOTE — Progress Notes (Signed)
Progress Note  Patient Name: Miranda Christensen Date of Encounter: 06/08/2021  [facility] Cardiologist: New (Dr. Bjorn Pippin)  Subjective   No acute overnight events. Doing well this morning. No chest pain or shortness of breath. Plan is for cardiac catheterization later today. No additional questions about procedure this morning.  Inpatient Medications    Scheduled Meds:  aspirin  81 mg Oral Daily   atorvastatin  80 mg Oral Daily   cycloSPORINE  1 drop Both Eyes BID   lisinopril  10 mg Oral Daily   metoprolol succinate  12.5 mg Oral Daily   montelukast  10 mg Oral q morning   sodium chloride flush  3 mL Intravenous Q12H   zolpidem  5 mg Oral QHS   Continuous Infusions:  sodium chloride     sodium chloride 1 mL/kg/hr (06/08/21 0623)   heparin 1,000 Units/hr (06/07/21 0705)   PRN Meds: sodium chloride, acetaminophen **OR** acetaminophen, nitroGLYCERIN, ondansetron (ZOFRAN) IV, sodium chloride flush   Vital Signs    Vitals:   06/07/21 0836 06/07/21 0905 06/07/21 1508 06/07/21 2000  BP: 116/76 116/76 (!) 144/91   Pulse:  74 64   Resp:   18 16  Temp:   98 F (36.7 C)   TempSrc:   Oral   SpO2:   99%   Weight:      Height:        Intake/Output Summary (Last 24 hours) at 06/08/2021 0655 Last data filed at 06/07/2021 1900 Gross per 24 hour  Intake --  Output 900 ml  Net -900 ml      06/07/2021    5:41 AM 06/06/2021    2:59 PM 06/06/2021   11:29 AM  Last 3 Weights  Weight (lbs) 181 lb 8 oz 182 lb 4.8 oz 186 lb 8.2 oz  Weight (kg) 82.328 kg 82.691 kg 84.6 kg      Telemetry    Sinus rhythm with rates in the 50s to 70s at rest. - Personally Reviewed  ECG    No new ECG tracing today. - Personally Reviewed  Physical Exam   GEN: No acute distress.   Neck: No JVD. Cardiac: RRR. No murmurs, rubs, or gallops. Radial pulses 2+ and equal bilaterally. Respiratory: Clear to auscultation bilaterally. No wheezes, rhonchi, or rales. GI: Soft, non-distended, and  non-tender. MS: No lower extremity edema. No deformity. Skin: Warm and dry. Neuro:  No focal deficits. Psych: Normal affect. Responds appropriately.  Labs    High Sensitivity Troponin:   Recent Labs  Lab 06/06/21 0115 06/06/21 0328 06/06/21 0958 06/06/21 1255  TROPONINIHS 16 63* 85* 83*     Chemistry Recent Labs  Lab 06/06/21 0115 06/06/21 0958 06/07/21 0145  NA 137  --  138  K 4.5  --  3.8  CL 103  --  107  CO2 22  --  23  GLUCOSE 150*  --  99  BUN 10  --  11  CREATININE 1.09*  --  0.84  CALCIUM 9.0  --  8.9  MG  --  2.1  --   GFRNONAA 58*  --  >60  ANIONGAP 12  --  8    Lipids  Recent Labs  Lab 06/06/21 1255  CHOL 203*  TRIG 77  HDL 76  LDLCALC 112*  CHOLHDL 2.7    Hematology Recent Labs  Lab 06/06/21 0115 06/07/21 0611  WBC 9.7 8.2  RBC 4.72 4.39  HGB 13.9 13.2  HCT 42.1 39.2  MCV 89.2  89.3  MCH 29.4 30.1  MCHC 33.0 33.7  RDW 14.2 14.4  PLT 301 275   Thyroid  Recent Labs  Lab 06/06/21 1255  TSH 1.615    BNPNo results for input(s): BNP, PROBNP in the last 168 hours.  DDimer No results for input(s): DDIMER in the last 168 hours.   Radiology    ECHOCARDIOGRAM COMPLETE  Result Date: 06/06/2021    ECHOCARDIOGRAM REPORT   Patient Name:   Miranda Christensen Date of Exam: 06/06/2021 Medical Rec #:  [id number]     Height:       63.0 in Accession #:    [id number]    Weight:       182.3 lb Date of Birth:  13-Mar-1960     BSA:          1.859 m Patient Age:    61 years      BP:           127/80 mmHg Patient Gender: F             HR:           58 bpm. Exam Location:  Inpatient Procedure: 2D Echo Indications:    chest pain  History:        Patient has no prior history of Echocardiogram examinations.                 Risk Factors:Hypertension and Dyslipidemia.  Sonographer:    Delcie Roch RDCS Referring Phys: [id number] [place]ikawa  Sonographer Comments: Image acquisition challenging due to respiratory motion. IMPRESSIONS  1. Left ventricular ejection  fraction, by estimation, is 60 to 65%. The left ventricle has normal function. The left ventricle has no regional wall motion abnormalities. Left ventricular diastolic parameters were normal.  2. Right ventricular systolic function is normal. The right ventricular size is normal. Tricuspid regurgitation signal is inadequate for assessing PA pressure.  3. The mitral valve is normal in structure. No evidence of mitral valve regurgitation. No evidence of mitral stenosis.  4. The aortic valve is tricuspid. Aortic valve regurgitation is trivial. No aortic stenosis is present.  5. The inferior vena cava is normal in size with greater than 50% respiratory variability, suggesting right atrial pressure of 3 mmHg. FINDINGS  Left Ventricle: Left ventricular ejection fraction, by estimation, is 60 to 65%. The left ventricle has normal function. The left ventricle has no regional wall motion abnormalities. The left ventricular internal cavity size was normal in size. There is  no left ventricular hypertrophy. Left ventricular diastolic parameters were normal. Right Ventricle: The right ventricular size is normal. No increase in right ventricular wall thickness. Right ventricular systolic function is normal. Tricuspid regurgitation signal is inadequate for assessing PA pressure. Left Atrium: Left atrial size was normal in size. Right Atrium: Right atrial size was normal in size. Pericardium: There is no evidence of pericardial effusion. Mitral Valve: The mitral valve is normal in structure. No evidence of mitral valve regurgitation. No evidence of mitral valve stenosis. Tricuspid Valve: The tricuspid valve is normal in structure. Tricuspid valve regurgitation is not demonstrated. Aortic Valve: The aortic valve is tricuspid. Aortic valve regurgitation is trivial. No aortic stenosis is present. Pulmonic Valve: The pulmonic valve was not well visualized. Pulmonic valve regurgitation is not visualized. Aorta: The aortic root and  ascending aorta are structurally normal, with no evidence of dilitation. Venous: The inferior vena cava is normal in size with greater than 50% respiratory variability, suggesting right atrial pressure of  3 mmHg. IAS/Shunts: The interatrial septum was not well visualized.  LEFT VENTRICLE PLAX 2D LVIDd:         4.30 cm   Diastology LVIDs:         2.90 cm   LV e' medial:    10.70 cm/s LV PW:         1.00 cm   LV E/e' medial:  5.4 LV IVS:        0.90 cm   LV e' lateral:   12.50 cm/s LVOT diam:     1.90 cm   LV E/e' lateral: 4.6 LV SV:         46 LV SV Index:   25 LVOT Area:     2.84 cm  RIGHT VENTRICLE RV S prime:     10.40 cm/s TAPSE (M-mode): 2.4 cm LEFT ATRIUM             Index        RIGHT ATRIUM           Index LA diam:        3.60 cm 1.94 cm/m   RA Area:     11.90 cm LA Vol (A2C):   40.2 ml 21.62 ml/m  RA Volume:   27.60 ml  14.85 ml/m LA Vol (A4C):   37.7 ml 20.28 ml/m LA Biplane Vol: 39.2 ml 21.09 ml/m  AORTIC VALVE LVOT Vmax:   73.70 cm/s LVOT Vmean:  46.700 cm/s LVOT VTI:    0.161 m  AORTA Ao Root diam: 2.90 cm Ao Asc diam:  3.30 cm MITRAL VALVE MV Area (PHT): 4.49 cm    SHUNTS MV Decel Time: 169 msec    Systemic VTI:  0.16 m MV E velocity: 57.50 cm/s  Systemic Diam: 1.90 cm MV A velocity: 66.30 cm/s MV E/A ratio:  0.87 Epifanio Lesches MD Electronically signed by Epifanio Lesches MD Signature Date/Time: 06/06/2021/6:20:13 PM    Final     Cardiac Studies   Echocardiogram 06/06/2021: Impressions: 1. Left ventricular ejection fraction, by estimation, is 60 to 65%. The  left ventricle has normal function. The left ventricle has no regional  wall motion abnormalities. Left ventricular diastolic parameters were  normal.   2. Right ventricular systolic function is normal. The right ventricular  size is normal. Tricuspid regurgitation signal is inadequate for assessing  PA pressure.   3. The mitral valve is normal in structure. No evidence of mitral valve  regurgitation. No evidence of  mitral stenosis.   4. The aortic valve is tricuspid. Aortic valve regurgitation is trivial.  No aortic stenosis is present.   5. The inferior vena cava is normal in size with greater than 50%  respiratory variability, suggesting right atrial pressure of 3 mmHg.  Patient Profile     61 y.o. female with a history of hypertension, hyperlipidemia, GERD, anxiety/depression, and tobacco use who was admitted on 06/06/2021 with NSTEMI after presenting with chest pain.  Assessment & Plan    NSTEMI High-sensitivity troponin 16 >> 63 >> 85 >> 83. Echo showed LVEF of 60-65% with normal wall motion and diastolic parameters.  - Currently chest pain free. - Continue IV Heparin. - Continue aspirin, beta-blocker, and high-intensity statin.  - Plan is for cardiac catheterization today. Patient was consented for procedure yesterday. Patient has no additional question about procedure this morning.  Hypertension BP mildly elevated this morning but has been well controlled. - Continue Lisinopril  daily and Toprol-XL 12.5mg  daily.  Hyperlipidemia Lipid panel this admission:  Total Cholesterol 203, Triglycerides 77, HDL 76, LDL 112.  - On Simvastatin 40mg  daily at home. Switched to Lipitor 80mg  daily this admission. - Will need repeat lipid panel and LFTs in 6-8 weeks.   For questions or updates, please contact [facility] Please consult [email or web address] for contact info under        Signed, Corrin ParkerCallie E Hien Cunliffe, PA-C  06/08/2021, 6:55 AM

## 2021-06-08 NOTE — Brief Op Note (Signed)
BRIEF CARDIAC CATHETERIZATION NOTE  DATE: 06/08/2021  TIME: 11:54 AM  PATIENT:  Miranda Christensen  61 y.o. female  PRE-OPERATIVE DIAGNOSIS:  NSTEMI  POST-OPERATIVE DIAGNOSIS:  Nonobstructive coronary artery disease  PROCEDURE:  Procedure(s): LEFT HEART CATH AND CORONARY ANGIOGRAPHY (N/A)  SURGEON:  Surgeon(s) and Role:    * Aron Inge, MD - Primary  FINDINGS: Mild, non-obstructive coronary artery disease with 20% ostial LAD stenosis.  Otherwise, no significant coronary artery disease. Mildly elevated left ventricular filling pressure (LVEDP 16 mmHg).  RECOMMENDATIONS: Medical therapy and risk factor modification to prevent progression of CAD.  Miranda Bush, MD [facility]

## 2021-06-08 NOTE — Discharge Instructions (Signed)
Radial Site Care: °Refer to this sheet in the next few weeks. These instructions provide you with information on caring for yourself after your procedure. Your caregiver may also give you more specific instructions. Your treatment has been planned according to current medical practices, but problems sometimes occur. Call your caregiver if you have any problems or questions after your procedure. °HOME CARE INSTRUCTIONS °You may shower the day after the procedure. Remove the bandage (dressing) and gently wash the site with plain soap and water. Gently pat the site dry.  °Do not apply powder or lotion to the site.  °Do not submerge the affected site in water for 3 to 5 days.  °Inspect the site at least twice daily.  °Do not flex or bend the affected arm for 24 hours.  °No lifting over 5 pounds (2.3 kg) for 5 days after your procedure.  °Do not drive home if you are discharged the same day of the procedure. Have someone else drive you.  °What to expect: °Any bruising will usually fade within 1 to 2 weeks.  °Blood that collects in the tissue (hematoma) may be painful to the touch. It should usually decrease in size and tenderness within 1 to 2 weeks.  °SEEK IMMEDIATE MEDICAL CARE IF: °You have unusual pain at the radial site.  °You have redness, warmth, swelling, or pain at the radial site.  °You have drainage (other than a small amount of blood on the dressing).  °You have chills.  °You have a fever or persistent symptoms for more than 72 hours.  °You have a fever and your symptoms suddenly get worse.  °Your arm becomes pale, cool, tingly, or numb.  °You have heavy bleeding from the site. Hold pressure on the site.  ° °

## 2021-06-08 NOTE — Interval H&P Note (Signed)
History and Physical Interval Note:  06/08/2021 11:14 AM  [name]  has presented today for surgery, with the diagnosis of NSTEMI.  The various methods of treatment have been discussed with the patient and family. After consideration of risks, benefits and other options for treatment, the patient has consented to  Procedure(s): LEFT HEART CATH AND CORONARY ANGIOGRAPHY (N/A) as a surgical intervention.  The patient's history has been reviewed, patient examined, no change in status, stable for surgery.  I have reviewed the patient's chart and labs.  Questions were answered to the patient's satisfaction.    Cath Lab Visit (complete for each Cath Lab visit)  Clinical Evaluation Leading to the Procedure:   ACS: Yes.    Non-ACS:  N/A   Miranda Christensen

## 2021-06-08 NOTE — TOC Transition Note (Signed)
Transition of Care [facility]) - CM/SW Discharge Note   Patient Details  Name: Miranda Christensen MRN: [id number] Date of Birth: 12-13-1960  [facility] CM/SW Contact:  Kermit Balo, RN Phone Number: 06/08/2021, 3:16 PM   Clinical Narrative:    Patient discharging home with self care. No needs per TOC.    Final next level of care: Home/Self Care Barriers to Discharge: No Barriers Identified   Patient Goals and CMS Choice        Discharge Placement                       Discharge Plan and Services                                     Social Determinants of Health (SDOH) Interventions     Readmission Risk Interventions     View : No data to display.

## 2021-06-08 NOTE — Discharge Summary (Signed)
Physician Discharge Summary  Miranda Horamma L Hakala ZOX:[id number] DOB: [birth year] DOA: 06/06/2021  PCP: Aldean BakerPannell, Christa, MD  Admit date: 06/06/2021 Discharge date: 06/08/2021  Admitted From: Home Disposition:  Home  Recommendations for Outpatient Follow-up:  Follow up with PCP in 1-2 weeks  Discharge Condition:Stable  CODE STATUS:Full  Diet recommendation: Low salt low fat diet    Brief/Interim Summary: Miranda Christensen is a 61 y.o. female with medical history significant of anxiety/depression; HTN; and HLD presenting with chest pain radiating to her neck/throat. Appears to be non-exertional. Improved with nitro. Cardiology consulted for further workup and evaluation.   Assessment & Plan:    Unstable angina, POA NSTEMI, rule out ACS - Troponin negative at intake -peak 85, reassuringly low - Cardiology following -appreciate insight and recommendations - Cardiac cath 5/22 show mild non-obstructive disease -Continue core measures including aspirin, atorvastatin, lisinopril, metoprolol   HTN, essential -Continue lisinopril, as needed hydralazine   HLD -Continue atorvastatin (previously on simvastatin)  Discharge Diagnoses:  Principal Problem:   NSTEMI (non-ST elevated myocardial infarction) [facility](HCC) Active Problems:   Essential hypertension   Hyperlipidemia    Discharge Instructions  Discharge Instructions     Discharge patient   Complete by: As directed    Discharge disposition: 01-Home or Self Care   Discharge patient date: 06/08/2021      Allergies as of 06/08/2021       Reactions   Naproxen Nausea And Vomiting        Medication List     STOP taking these medications    simvastatin 40 MG tablet Commonly known as: ZOCOR       TAKE these medications    aspirin 81 MG chewable tablet Chew 1 tablet (81 mg total) by mouth daily. Start taking on: Jun 09, 2021   atorvastatin 20 MG tablet Commonly known as: LIPITOR Take 1 tablet (20 mg total) by mouth  daily. Start taking on: Jun 09, 2021   estradiol 0.5 MG tablet Commonly known as: ESTRACE Take 0.5 mg by mouth daily.   lisinopril 10 MG tablet Commonly known as: ZESTRIL Take 10 mg by mouth daily.   metoprolol succinate 25 MG 24 hr tablet Commonly known as: TOPROL-XL Take 0.5 tablets (12.5 mg total) by mouth daily. Start taking on: Jun 09, 2021   montelukast 10 MG tablet Commonly known as: SINGULAIR Take 10 mg by mouth every morning.   pramipexole 0.125 MG tablet Commonly known as: MIRAPEX Take 1 tablet by mouth at bedtime as needed (for restless leg).   Restasis 0.05 % ophthalmic emulsion Generic drug: cycloSPORINE Place 1 drop into both eyes 2 (two) times daily.   senna 8.6 MG Tabs tablet Commonly known as: SENOKOT Take 1 tablet by mouth daily as needed for mild constipation.   zolpidem 10 MG tablet Commonly known as: AMBIEN Take 10 mg by mouth at bedtime.        Follow-up Information     Corrin ParkerGoodrich, Callie E, PA-C Follow up.   Specialties: Physician Assistant, Cardiology Why: Hospital follow-up with Cardiology scheduled for 06/24/2021 at 2:45pm. Please arrive 15 minutes early for check-in. If this date/time does not work for you, please call our office to reschedule. Contact information: [address] [place] [address] [phone]                Allergies  Allergen Reactions   Naproxen Nausea And Vomiting    Consultations: Cardiology   Procedures/Studies: DG Chest 2 View  Result Date: 06/06/2021 CLINICAL DATA:  Chest pain.  Weakness. EXAM: CHEST - 2 VIEW COMPARISON:  08/15/2017 FINDINGS: Lung volumes are low.The cardiomediastinal contours are normal. Pulmonary vasculature is normal. No consolidation, pleural effusion, or pneumothorax. No acute osseous abnormalities are seen. IMPRESSION: Low lung volumes without acute abnormality. Electronically Signed   By: Narda Rutherford M.D.   On: 06/06/2021 01:44   CARDIAC  CATHETERIZATION  Result Date: 06/08/2021 Conclusions: Mild, nonobstructive coronary artery disease with 20% stenosis at the ostium of the LAD.  An element of vasospasm cannot be excluded.  Otherwise, there is no angiographically significant coronary artery disease. Mildly elevated left ventricular filling pressure (LVEDP 16 mmHg). Recommendations: No clear culprit lesion identified for patient's chest pain and elevated troponin.  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  Consider further work-up for other potential etiologies of chest pain and elevated troponin per primary teams. Medical therapy and risk factor modification to prevent progression of mild coronary artery disease. Yvonne Kendall, MD [facility]  ECHOCARDIOGRAM COMPLETE  Result Date: 06/06/2021    ECHOCARDIOGRAM REPORT   Patient Name:   Miranda Christensen Date of Exam: 06/06/2021 Medical Rec #:  [id number]     Height:       63.0 in Accession #:    [id number]    Weight:       182.3 lb Date of Birth:  [birth year]     BSA:          1.859 m Patient Age:    61 years      BP:           127/80 mmHg Patient Gender: F             HR:           58 bpm. Exam Location:  Inpatient Procedure: 2D Echo Indications:    chest pain  History:        Patient has no prior history of Echocardiogram examinations.                 Risk Factors:Hypertension and Dyslipidemia.  Sonographer:    Delcie Roch RDCS Referring Phys: [id number] Little Ishikawa  Sonographer Comments: Image acquisition challenging due to respiratory motion. IMPRESSIONS  1. Left ventricular ejection fraction, by estimation, is 60 to 65%. The left ventricle has normal function. The left ventricle has no regional wall motion abnormalities. Left ventricular diastolic parameters were normal.  2. Right ventricular systolic function is normal. The right ventricular size is normal. Tricuspid regurgitation signal is inadequate for assessing PA pressure.  3.  The mitral valve is normal in structure. No evidence of mitral valve regurgitation. No evidence of mitral stenosis.  4. The aortic valve is tricuspid. Aortic valve regurgitation is trivial. No aortic stenosis is present.  5. The inferior vena cava is normal in size with greater than 50% respiratory variability, suggesting right atrial pressure of 3 mmHg. FINDINGS  Left Ventricle: Left ventricular ejection fraction, by estimation, is 60 to 65%. The left ventricle has normal function. The left ventricle has no regional wall motion abnormalities. The left ventricular internal cavity size was normal in size. There is  no left ventricular hypertrophy. Left ventricular diastolic parameters were normal. Right Ventricle: The right ventricular size is normal. No increase in right ventricular wall thickness. Right ventricular systolic function is normal. Tricuspid regurgitation signal is inadequate for assessing PA pressure. Left Atrium: Left atrial size was normal in size. Right Atrium: Right atrial size was normal in size. Pericardium: There is  no evidence of pericardial effusion. Mitral Valve: The mitral valve is normal in structure. No evidence of mitral valve regurgitation. No evidence of mitral valve stenosis. Tricuspid Valve: The tricuspid valve is normal in structure. Tricuspid valve regurgitation is not demonstrated. Aortic Valve: The aortic valve is tricuspid. Aortic valve regurgitation is trivial. No aortic stenosis is present. Pulmonic Valve: The pulmonic valve was not well visualized. Pulmonic valve regurgitation is not visualized. Aorta: The aortic root and ascending aorta are structurally normal, with no evidence of dilitation. Venous: The inferior vena cava is normal in size with greater than 50% respiratory variability, suggesting right atrial pressure of 3 mmHg. IAS/Shunts: The interatrial septum was not well visualized.  LEFT VENTRICLE PLAX 2D LVIDd:         4.30 cm   Diastology LVIDs:         2.90 cm   LV  e' medial:    10.70 cm/s LV PW:         1.00 cm   LV E/e' medial:  5.4 LV IVS:        0.90 cm   LV e' lateral:   12.50 cm/s LVOT diam:     1.90 cm   LV E/e' lateral: 4.6 LV SV:         46 LV SV Index:   25 LVOT Area:     2.84 cm  RIGHT VENTRICLE RV S prime:     10.40 cm/s TAPSE (M-mode): 2.4 cm LEFT ATRIUM             Index        RIGHT ATRIUM           Index LA diam:        3.60 cm 1.94 cm/m   RA Area:     11.90 cm LA Vol (A2C):   40.2 ml 21.62 ml/m  RA Volume:   27.60 ml  14.85 ml/m LA Vol (A4C):   37.7 ml 20.28 ml/m LA Biplane Vol: 39.2 ml 21.09 ml/m  AORTIC VALVE LVOT Vmax:   73.70 cm/s LVOT Vmean:  46.700 cm/s LVOT VTI:    0.161 m  AORTA Ao Root diam: 2.90 cm Ao Asc diam:  3.30 cm MITRAL VALVE MV Area (PHT): 4.49 cm    SHUNTS MV Decel Time: 169 msec    Systemic VTI:  0.16 m MV E velocity: 57.50 cm/s  Systemic Diam: 1.90 cm MV A velocity: 66.30 cm/s MV E/A ratio:  0.87 Epifanio Lesches MD Electronically signed by Epifanio Lesches MD Signature Date/Time: 06/06/2021/6:20:13 PM    Final      Subjective: No acute issues/events overnight   Discharge Exam: Vitals:   06/08/21 1425 06/08/21 1445  BP: (!) 154/82 (!) 149/78  Pulse: (!) 58 (!) 58  Resp:    Temp:    SpO2: 96% 99%   Vitals:   06/08/21 1350 06/08/21 1355 06/08/21 1425 06/08/21 1445  BP: (!) 164/79 (!) 155/83 (!) 154/82 (!) 149/78  Pulse: (!) 56 (!) 59 (!) 58 (!) 58  Resp:      Temp:      TempSrc:      SpO2: 99% 98% 96% 99%  Weight:      Height:        General: Pt is alert, awake, not in acute distress Cardiovascular: RRR, S1/S2 +, no rubs, no gallops Respiratory: CTA bilaterally, no wheezing, no rhonchi Abdominal: Soft, NT, ND, bowel sounds + Extremities: no edema, no cyanosis    The results of  significant diagnostics from this hospitalization (including imaging, microbiology, ancillary and laboratory) are listed below for reference.     Microbiology: No results found for this or any previous visit (from  the past 240 hour(s)).   Labs: BNP (last 3 results) No results for input(s): BNP in the last 8760 hours. Basic Metabolic Panel: Recent Labs  Lab 06/06/21 0115 06/06/21 0958 06/07/21 0145 06/08/21 0635  NA 137  --  138 140  K 4.5  --  3.8 4.4  CL 103  --  107 108  CO2 22  --  23 27  GLUCOSE 150*  --  99 95  BUN 10  --  11 15  CREATININE 1.09*  --  0.84 1.01*  CALCIUM 9.0  --  8.9 8.6*  MG  --  2.1  --   --    Liver Function Tests: No results for input(s): AST, ALT, ALKPHOS, BILITOT, PROT, ALBUMIN in the last 168 hours. No results for input(s): LIPASE, AMYLASE in the last 168 hours. No results for input(s): AMMONIA in the last 168 hours. CBC: Recent Labs  Lab 06/06/21 0115 06/07/21 0611 06/08/21 0635  WBC 9.7 8.2 8.5  HGB 13.9 13.2 12.9  HCT 42.1 39.2 38.4  MCV 89.2 89.3 89.7  PLT 301 275 265   Cardiac Enzymes: No results for input(s): CKTOTAL, CKMB, CKMBINDEX, TROPONINI in the last 168 hours. BNP: Invalid input(s): POCBNP CBG: No results for input(s): GLUCAP in the last 168 hours. D-Dimer No results for input(s): DDIMER in the last 72 hours. Hgb A1c Recent Labs    06/06/21 1255  HGBA1C 5.9*   Lipid Profile Recent Labs    06/06/21 1255  CHOL 203*  HDL 76  LDLCALC 112*  TRIG 77  CHOLHDL 2.7   Thyroid function studies Recent Labs    06/06/21 1255  TSH 1.615   Anemia work up No results for input(s): VITAMINB12, FOLATE, FERRITIN, TIBC, IRON, RETICCTPCT in the last 72 hours. Urinalysis    Component Value Date/Time   COLORURINE STRAW (A) 08/15/2017 1500   APPEARANCEUR CLEAR 08/15/2017 1500   LABSPEC 1.002 (L) 08/15/2017 1500   PHURINE 9.0 (H) 08/15/2017 1500   GLUCOSEU NEGATIVE 08/15/2017 1500   HGBUR NEGATIVE 08/15/2017 1500   BILIRUBINUR NEGATIVE 08/15/2017 1500   KETONESUR NEGATIVE 08/15/2017 1500   PROTEINUR NEGATIVE 08/15/2017 1500   NITRITE NEGATIVE 08/15/2017 1500   LEUKOCYTESUR NEGATIVE 08/15/2017 1500   Sepsis Labs Invalid  input(s): PROCALCITONIN,  WBC,  LACTICIDVEN Microbiology No results found for this or any previous visit (from the past 240 hour(s)).   Time coordinating discharge: Over 30 minutes  SIGNED:   Azucena Fallen, DO [facility] 06/08/2021, 3:08 PM Pager   If 7PM-7AM, please contact night-coverage [email or web address]

## 2021-06-14 NOTE — Progress Notes (Unsigned)
Cardiology Office Note:    Date:  06/24/2021   ID:  [name], DOB [birth year], MRN [id number]  PCP:  Aldean Baker, MD  Cardiologist:  Little Ishikawa, MD  Electrophysiologist:  None   Referring MD: Aldean Baker, MD   Chief Complaint: hospital follow-up of chest pain  History of Present Illness:    Miranda Christensen is a 61 y.o. female with a history of mild non-obstruction on recent cardiac catheterization in 05/2021, hypertension, hyperlipidemia, and GERD who is followed by Dr. Bjorn Pippin and presents today for hospital follow-up of chest pain.  Patient first seen by Cardiology during recent hospitalization. She was admitted from 06/06/2021 to 06/08/2021 after presenting with chest pain and being found to have minimally elevated troponin. High-sensitivity troponin peaked at 85. Echo showed LVEF of 60-65% with normal wall motion. LHC showed mild non-obstructive CAD with only 20% stenosis of ostial LAD. Vasospasm could not be excluded. She was started on Aspirin and Toprol-XL and home Simvastatin was switched to Lipitor.  Patient presents today for follow-up. Here alone. She is doing well since discharge. She reports occasional very brief chest squeezing/tightness that only last for a couple of seconds at a times and then resolves. No other chest pain. No shortness of breath, orthopnea, PND, or low extremity edema. She describes occasional brief palpitations but no prolonged or sustained palpitations. No lightheadedness/dizziness or syncope.   Past Medical History:  Diagnosis Date   Anxiety and depression    after losing her son 5 years ago   CAD (coronary artery disease)    a. LHC 05/2021: 20% ostial LAD   GERD (gastroesophageal reflux disease)    Hyperlipidemia    Hypertension    Nasal fracture     Past Surgical History:  Procedure Laterality Date   ABDOMINAL HYSTERECTOMY     CARPAL TUNNEL RELEASE Bilateral    CLOSED REDUCTION NASAL FRACTURE Bilateral 03/23/2019    Procedure: CLOSED REDUCTION NASAL FRACTURE;  Surgeon: Christia Reading, MD;  Location: [facility];  Service: ENT;  Laterality: Bilateral;   LEFT HEART CATH AND CORONARY ANGIOGRAPHY N/A 06/08/2021   Procedure: LEFT HEART CATH AND CORONARY ANGIOGRAPHY;  Surgeon: Yvonne Kendall, MD;  Location: [facility];  Service: Cardiovascular;  Laterality: N/A;   PLANTAR FASCIA RELEASE Bilateral    TONSILLECTOMY      Current Medications: Current Meds  Medication Sig   aspirin 81 MG chewable tablet Chew 1 tablet (81 mg total) by mouth daily.   atorvastatin (LIPITOR) 20 MG tablet Take 1 tablet (20 mg total) by mouth daily.   estradiol (ESTRACE) 0.5 MG tablet Take 0.5 mg by mouth daily.   lisinopril (PRINIVIL,ZESTRIL) 10 MG tablet Take 10 mg by mouth daily.   metoprolol succinate (TOPROL-XL) 25 MG 24 hr tablet Take 0.5 tablets (12.5 mg total) by mouth daily.   montelukast (SINGULAIR) 10 MG tablet Take 10 mg by mouth every morning.    pramipexole (MIRAPEX) 0.125 MG tablet Take 1 tablet by mouth at bedtime as needed (for restless leg).    RESTASIS 0.05 % ophthalmic emulsion Place 1 drop into both eyes 2 (two) times daily.   senna (SENOKOT) 8.6 MG TABS tablet Take 1 tablet by mouth daily as needed for mild constipation.   zolpidem (AMBIEN) 10 MG tablet Take 10 mg by mouth at bedtime.     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  Tobacco Use   Smoking status: Former    Packs/day: 0.50    Years: 20.00    Pack years: 10.00    Types: Cigarettes    Quit date: 2008    Years since quitting: 15.4   Smokeless tobacco: Never  Vaping Use   Vaping Use: Never used  Substance and Sexual Activity   Alcohol use: Yes    Comment: rare    Drug use: Never   Sexual activity: Not on file  Other Topics Concern   Not on file  Social History Narrative   Not on file   Social Determinants of Health   Financial Resource Strain: Not on file  Food Insecurity: Not on file  Transportation Needs: Not on file  Physical Activity: Not on file  Stress: Not on file  Social Connections: Not on file     Family History: The patient's family history includes Cancer in her father; Dementia in her father. There is no history of CAD.  ROS:   Please see the history of present illness.     EKGs/Labs/Other Studies Reviewed:    The following studies were reviewed today:  Echocardiogram 06/06/2021: Impressions:  1. Left ventricular ejection fraction, by estimation, is 60 to 65%. The  left ventricle has normal function. The left ventricle has no regional  wall motion abnormalities. Left ventricular diastolic parameters were  normal.   2. Right ventricular systolic function is normal. The right ventricular  size is normal. Tricuspid regurgitation signal is inadequate for assessing  PA pressure.   3. The mitral valve is normal in structure. No evidence of mitral valve  regurgitation. No evidence of mitral stenosis.   4. The aortic valve is tricuspid. Aortic valve regurgitation is trivial.  No aortic stenosis is present.   5. The inferior vena cava is normal in size with greater than 50%  respiratory variability, suggesting right atrial pressure of 3 mmHg. _______________  Left Cardiac Catheterization 06/08/2021: Conclusions: Mild, nonobstructive coronary artery disease with 20% stenosis at the ostium of the LAD.  An element of vasospasm cannot be excluded.  Otherwise, there is no angiographically significant coronary artery disease. Mildly elevated left ventricular filling pressure (LVEDP 16 mmHg).   Recommendations: No clear culprit lesion identified for patient's chest pain and elevated troponin.  Findings are consistent with MINOCA  (MI with non-obstructive coronary artery disease).  Consider further work-up for other potential etiologies of chest pain and elevated troponin per primary teams. Medical therapy and risk factor modification to prevent progression of mild coronary artery disease.  Diagnostic Dominance: Right    EKG:  EKG not ordered today.   Recent Labs: 06/06/2021: Magnesium 2.1; TSH 1.615 06/08/2021: BUN 15; Creatinine, Ser 1.01; Hemoglobin 12.9; Platelets 265; Potassium 4.4; Sodium 140  Recent Lipid Panel    Component Value Date/Time   CHOL 203 (H) 06/06/2021 1255   TRIG 77 06/06/2021 1255   HDL 76 06/06/2021 1255   CHOLHDL 2.7 06/06/2021 1255   VLDL 15 06/06/2021 1255   LDLCALC 112 (H) 06/06/2021 1255    Physical Exam:    Vital Signs: BP 120/76   Pulse (!) 54   Ht 5\' 3"  (1.6 m)   Wt 181 lb (82.1 kg)   SpO2 100%   BMI 32.06 kg/m     Wt  Readings from Last 3 Encounters:  06/24/21 181 lb (82.1 kg)  06/07/21 181 lb 8 oz (82.3 kg)  03/23/19 166 lb 7.2 oz (75.5 kg)     General: 61 y.o. Caucasian female in no acute distress. HEENT: Normocephalic and atraumatic. Sclera clear.  Neck: Supple. No carotid bruits. No JVD. Heart: Mildly bradycardic with normal rate. Distinct S1 and S2. No murmurs, gallops, or rubs. Right radial cath site soft with no signs of hematoma. Lungs: No increased work of breathing. Clear to ausculation bilaterally. No wheezes, rhonchi, or rales.  Abdomen: Soft, non-distended, and non-tender to palpation.  Extremities: No lower extremity edema.    Skin: Warm and dry. Neuro: Alert and oriented x3. No focal deficits. Psych: Normal affect. Responds appropriately.  Assessment:    1. Non-obstructive CAD   2. Primary hypertension   3. Hyperlipidemia, unspecified hyperlipidemia type     Plan:    Non-Obstructive CAD Patient recently admitted with chest pain. LHC showed minimal non-obstructive CAD. Vasospasm could not be excluded.  - She reports occasional very brief  atypical chest pain that only last for a couple of seconds a time. Nothing that sounds like angina. - Continue aspirin, beta-blocker, and high-intensity statin. - Will repeat BMET today to ensure renal function is stable after cardiac cath. - Advised patient to let us know if chest pain worsens or becomes more frequent. Would consider adding Amlodipine or Imdur at that time for possible vasospasm or small vessel disease.  Hypertension BP well controlled. - Continue Toprol-XL 12.5mg  daily and Lisinopril 10mg  daily.  Hyperlipidemia Lipid panel during recent admission: Total Cholesterol 203, Triglycerides 77, HDL 76, LDL 112. LDL goal <70 given CAD. - Switched from Simvastatin to Lipitor 20mg  daily during recent admission. Continue. - Will repeat lipid panel and LFTs in 6-8 weeks.  Disposition: Follow up in 6 months.   Medication Adjustments/Labs and Tests Ordered: Current medicines are reviewed at length with the patient today.  Concerns regarding medicines are outlined above.  Orders Placed This Encounter  Procedures   Basic metabolic panel   Lipid panel   Hepatic function panel   No orders of the defined types were placed in this encounter.   Patient Instructions  Medication Instructions:  Your physician recommends that you continue on your current medications as directed. Please refer to the Current Medication list given to you today.   *If you need a refill on your cardiac medications before your next appointment, please call your pharmacy*   Lab Work: Your physician recommends that you complete labs today and return for lab work in 6 weeks BMET (Today) Lipid panel & HFP (6 weeks)  If you have labs (blood work) drawn today and your tests are completely normal, you will receive your results only by: MyChart Message (if you have MyChart) OR A paper copy in the mail If you have any lab test that is abnormal or we need to change your treatment, we will call you to review the  results.   Testing/Procedures: NONE ordered at this time of appointment     Follow-Up: At [facility], you and your health needs are our priority.  As part of our continuing mission to provide you with exceptional heart care, we have created designated Provider Care Teams.  These Care Teams include your primary Cardiologist (physician) and Advanced Practice Providers (APPs -  Physician Assistants and Nurse Practitioners) who all work together to provide you with the care you need, when you need it.  We recommend signing  up for the patient portal called "MyChart".  Sign up information is provided on this After Visit Summary.  MyChart is used to connect with patients for Virtual Visits (Telemedicine).  Patients are able to view lab/test results, encounter notes, upcoming appointments, etc.  Non-urgent messages can be sent to your provider as well.   To learn more about what you can do with MyChart, go to [email or web address].    Your next appointment:   6 month(s)  The format for your next appointment:   In Person  Provider:   Marjie Skiff, PA-C        Other Instructions   Important Information About Sugar         Signed, Corrin Parker, PA-C  06/24/2021 2:53 PM    [facility]

## 2021-06-17 ENCOUNTER — Ambulatory Visit: Payer: BC Managed Care – PPO | Admitting: Student

## 2021-06-23 ENCOUNTER — Encounter: Payer: Self-pay | Admitting: Student

## 2021-06-23 DIAGNOSIS — I2510 Atherosclerotic heart disease of native coronary artery without angina pectoris: Secondary | ICD-10-CM | POA: Insufficient documentation

## 2021-06-24 ENCOUNTER — Encounter: Payer: Self-pay | Admitting: Student

## 2021-06-24 ENCOUNTER — Other Ambulatory Visit: Payer: Self-pay

## 2021-06-24 ENCOUNTER — Ambulatory Visit (INDEPENDENT_AMBULATORY_CARE_PROVIDER_SITE_OTHER): Payer: BC Managed Care – PPO | Admitting: Student

## 2021-06-24 VITALS — BP 120/76 | HR 54 | Ht 63.0 in | Wt 181.0 lb

## 2021-06-24 DIAGNOSIS — E785 Hyperlipidemia, unspecified: Secondary | ICD-10-CM

## 2021-06-24 DIAGNOSIS — I10 Essential (primary) hypertension: Secondary | ICD-10-CM

## 2021-06-24 DIAGNOSIS — I2510 Atherosclerotic heart disease of native coronary artery without angina pectoris: Secondary | ICD-10-CM

## 2021-06-24 NOTE — Patient Instructions (Signed)
Medication Instructions:  Your physician recommends that you continue on your current medications as directed. Please refer to the Current Medication list given to you today.   *If you need a refill on your cardiac medications before your next appointment, please call your pharmacy*   Lab Work: Your physician recommends that you complete labs today and return for lab work in 6 weeks BMET (Today) Lipid panel & HFP (6 weeks)  If you have labs (blood work) drawn today and your tests are completely normal, you will receive your results only by: MyChart Message (if you have MyChart) OR A paper copy in the mail If you have any lab test that is abnormal or we need to change your treatment, we will call you to review the results.   Testing/Procedures: NONE ordered at this time of appointment     Follow-Up: At [facility], you and your health needs are our priority.  As part of our continuing mission to provide you with exceptional heart care, we have created designated Provider Care Teams.  These Care Teams include your primary Cardiologist (physician) and Advanced Practice Providers (APPs -  Physician Assistants and Nurse Practitioners) who all work together to provide you with the care you need, when you need it.  We recommend signing up for the patient portal called "MyChart".  Sign up information is provided on this After Visit Summary.  MyChart is used to connect with patients for Virtual Visits (Telemedicine).  Patients are able to view lab/test results, encounter notes, upcoming appointments, etc.  Non-urgent messages can be sent to your provider as well.   To learn more about what you can do with MyChart, go to [email or web address].    Your next appointment:   6 month(s)  The format for your next appointment:   In Person  Provider:   Marjie Skiff, PA-C        Other Instructions   Important Information About Sugar

## 2021-06-25 LAB — BASIC METABOLIC PANEL
BUN/Creatinine Ratio: 9 — ABNORMAL LOW (ref 12–28)
BUN: 8 mg/dL (ref 8–27)
CO2: 22 mmol/L (ref 20–29)
Calcium: 9.6 mg/dL (ref 8.7–10.3)
Chloride: 107 mmol/L — ABNORMAL HIGH (ref 96–106)
Creatinine, Ser: 0.91 mg/dL (ref 0.57–1.00)
Glucose: 92 mg/dL (ref 70–99)
Potassium: 4.5 mmol/L (ref 3.5–5.2)
Sodium: 149 mmol/L — ABNORMAL HIGH (ref 134–144)
eGFR: 72 mL/min/{1.73_m2} (ref >59)

## 2021-06-26 ENCOUNTER — Other Ambulatory Visit: Payer: Self-pay

## 2021-06-26 DIAGNOSIS — I2510 Atherosclerotic heart disease of native coronary artery without angina pectoris: Secondary | ICD-10-CM

## 2021-06-26 DIAGNOSIS — I10 Essential (primary) hypertension: Secondary | ICD-10-CM

## 2021-06-26 DIAGNOSIS — E785 Hyperlipidemia, unspecified: Secondary | ICD-10-CM

## 2021-06-30 ENCOUNTER — Ambulatory Visit: Payer: Self-pay | Admitting: Allergy

## 2021-07-01 ENCOUNTER — Other Ambulatory Visit: Payer: Self-pay

## 2021-07-01 DIAGNOSIS — E785 Hyperlipidemia, unspecified: Secondary | ICD-10-CM

## 2021-07-01 DIAGNOSIS — I2510 Atherosclerotic heart disease of native coronary artery without angina pectoris: Secondary | ICD-10-CM

## 2021-07-01 DIAGNOSIS — I10 Essential (primary) hypertension: Secondary | ICD-10-CM

## 2021-07-01 LAB — BASIC METABOLIC PANEL
BUN/Creatinine Ratio: 8 — ABNORMAL LOW (ref 12–28)
BUN: 7 mg/dL — ABNORMAL LOW (ref 8–27)
CO2: 24 mmol/L (ref 20–29)
Calcium: 9.4 mg/dL (ref 8.7–10.3)
Chloride: 101 mmol/L (ref 96–106)
Creatinine, Ser: 0.86 mg/dL (ref 0.57–1.00)
Glucose: 90 mg/dL (ref 70–99)
Potassium: 4.9 mmol/L (ref 3.5–5.2)
Sodium: 138 mmol/L (ref 134–144)
eGFR: 77 mL/min/{1.73_m2} (ref >59)

## 2021-07-02 ENCOUNTER — Telehealth: Payer: Self-pay | Admitting: Cardiology

## 2021-07-02 NOTE — Telephone Encounter (Signed)
Pt was returning a call about her results

## 2021-07-02 NOTE — Telephone Encounter (Signed)
Spoke with patient and discussed lab results.  Corrin Parker, PA-C  07/01/2021  6:45 PM EDT     Please notify patient of results (it does not look like she routinely uses MyChart): Labs look good. Sodium level has returned to normal.   Thank you!   Patient verbalized understanding and expressed appreciation for call.

## 2021-07-06 MED ORDER — METOPROLOL SUCCINATE ER 25 MG PO TB24: 12.5000 mg | ORAL_TABLET | Freq: Every day | ORAL | 2 refills | Status: DC

## 2021-07-06 MED ORDER — ATORVASTATIN CALCIUM 20 MG PO TABS: 20.0000 mg | ORAL_TABLET | Freq: Every day | ORAL | 2 refills | Status: DC

## 2021-08-10 LAB — LIPID PANEL
Chol/HDL Ratio: 2.4 ratio (ref 0.0–4.4)
Cholesterol, Total: 149 mg/dL (ref 100–199)
HDL: 61 mg/dL (ref >39)
LDL Chol Calc (NIH): 70 mg/dL (ref 0–99)
Triglycerides: 99 mg/dL (ref 0–149)
VLDL Cholesterol Cal: 18 mg/dL (ref 5–40)

## 2021-08-10 LAB — HEPATIC FUNCTION PANEL
ALT: 22 IU/L (ref 0–32)
AST: 19 IU/L (ref 0–40)
Albumin: 4.4 g/dL (ref 3.8–4.9)
Alkaline Phosphatase: 72 IU/L (ref 44–121)
Bilirubin Total: 0.4 mg/dL (ref 0.0–1.2)
Bilirubin, Direct: 0.14 mg/dL (ref 0.00–0.40)
Total Protein: 6.5 g/dL (ref 6.0–8.5)

## 2021-12-20 NOTE — Progress Notes (Unsigned)
Cardiology Office Note:    Date:  12/20/2021   ID:  Miranda Christensen, DOB [birth year], MRN [id number]  PCP:  Aldean Baker, MD  Cardiologist:  Little Ishikawa, MD  Electrophysiologist:  None   Referring MD: Aldean Baker, MD   Chief Complaint: follow-up of CAD  History of Present Illness:    Miranda Christensen is a 61 y.o. female with a history of mild non-obstructive CAD on cardiac catheterization in 05/2021, hypertension, hyperlipidemia, and GERD who is followed by Dr. Bjorn Pippin and presents today for hospital follow-up of CAD.   Patient was admitted in 05/2021 after presenting with chest pain and was found to have minimally elevated troponin. High-sensitivity troponin peaked at 85. Echo showed LVEF of 60-65% with normal wall motion. LHC showed mild non-obstructive CAD with only 20% stenosis of ostial LAD. Vasospasm could not be excluded. She was started on Aspirin and Toprol-XL and home Simvastatin was switched to Lipitor. She was last seen by me in 06/2021 at which time she was doing well. She reported some occasional very brief episodes of chest squeezing/ tightness that only lasted for a couple seconds at a time but nothing that sounded like angina.  Patient presents today for follow-up. ***  Non-Obstructive CAD LHC in 05/2021 showed minimal non-obstructive CAD. Vasospasm could not be excluded.  - *** - Continue aspirin, beta-blocker, and high-intensity statin.   Hypertension BP well controlled. - Continue Toprol-XL 12.5mg  daily and Lisinopril 10mg  daily.   Hyperlipidemia Most recent lipid panel in 07/2021: Total Cholesterol 149 , Triglycerides 99, HDL 61, LDL 70. LDL goal <70 given CAD. - Continue Lipitor 20mg  daily.   Past Medical History:  Diagnosis Date   Anxiety and depression    after losing her son 5 years ago   CAD (coronary artery disease)    a. LHC 05/2021: 20% ostial LAD   GERD (gastroesophageal reflux disease)    Hyperlipidemia    Hypertension    Nasal  fracture     Past Surgical History:  Procedure Laterality Date   ABDOMINAL HYSTERECTOMY     CARPAL TUNNEL RELEASE Bilateral    CLOSED REDUCTION NASAL FRACTURE Bilateral 03/23/2019   Procedure: CLOSED REDUCTION NASAL FRACTURE;  Surgeon: 06/2021, MD;  Location: [facility];  Service: ENT;  Laterality: Bilateral;   LEFT HEART CATH AND CORONARY ANGIOGRAPHY N/A 06/08/2021   Procedure: LEFT HEART CATH AND CORONARY ANGIOGRAPHY;  Surgeon: Christia Reading, MD;  Location: [facility];  Service: Cardiovascular;  Laterality: N/A;   PLANTAR FASCIA RELEASE Bilateral    TONSILLECTOMY      Current Medications: No outpatient medications have been marked as taking for the 12/24/21 encounter (Appointment) with Yvonne Kendall, PA-C.     Allergies:   Naproxen   Social History   Socioeconomic History   Marital status: Single    Spouse name: Not on file   Number of children: Not on file   Years of education: Not on file   Highest education level: Not on file  Occupational History   Occupation: 14/7/23  Tobacco Use   Smoking status: Former    Packs/day: 0.50    Years: 20.00    Total pack years: 10.00    Types: Cigarettes    Quit date: 2008    Years since quitting: 15.9   Smokeless tobacco: Never  Vaping Use   Vaping Use: Never used  Substance and Sexual Activity   Alcohol use: Yes    Comment:  rare   Drug use: Never   Sexual activity: Not on file  Other Topics Concern   Not on file  Social History Narrative   Not on file   Social Determinants of Health   Financial Resource Strain: Not on file  Food Insecurity: Not on file  Transportation Needs: Not on file  Physical Activity: Not on file  Stress: Not on file  Social Connections: Not on file     Family History: The patient's family history includes Cancer in her father; Dementia in her father. There is no history of CAD.  ROS:   Please see the history of present illness.      EKGs/Labs/Other Studies Reviewed:    The following studies were reviewed today:  Echocardiogram 06/06/2021: Impressions:  1. Left ventricular ejection fraction, by estimation, is 60 to 65%. The  left ventricle has normal function. The left ventricle has no regional  wall motion abnormalities. Left ventricular diastolic parameters were  normal.   2. Right ventricular systolic function is normal. The right ventricular  size is normal. Tricuspid regurgitation signal is inadequate for assessing  PA pressure.   3. The mitral valve is normal in structure. No evidence of mitral valve  regurgitation. No evidence of mitral stenosis.   4. The aortic valve is tricuspid. Aortic valve regurgitation is trivial.  No aortic stenosis is present.   5. The inferior vena cava is normal in size with greater than 50%  respiratory variability, suggesting right atrial pressure of 3 mmHg. _______________   Left Cardiac Catheterization 06/08/2021: Conclusions: Mild, nonobstructive coronary artery disease with 20% stenosis at the ostium of the LAD.  An element of vasospasm cannot be excluded.  Otherwise, there is no angiographically significant coronary artery disease. Mildly elevated left ventricular filling pressure (LVEDP 16 mmHg).   Recommendations: No clear culprit lesion identified for patient's chest pain and elevated troponin.  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  Consider further work-up for other potential etiologies of chest pain and elevated troponin per primary teams. Medical therapy and risk factor modification to prevent progression of mild coronary artery disease.   Diagnostic Dominance: Right     EKG:  EKG not ordered today.   Recent Labs: 06/06/2021: Magnesium 2.1; TSH 1.615 06/08/2021: Hemoglobin 12.9; Platelets 265 07/01/2021: BUN 7; Creatinine, Ser 0.86; Potassium 4.9; Sodium 138 08/10/2021: ALT 22  Recent Lipid Panel     Component Value Date/Time   CHOL 149 08/10/2021 1000   TRIG 99 08/10/2021 1000   HDL 61 08/10/2021 1000   CHOLHDL 2.4 08/10/2021 1000   CHOLHDL 2.7 06/06/2021 1255   VLDL 15 06/06/2021 1255   LDLCALC 70 08/10/2021 1000    Physical Exam:    Vital Signs: There were no vitals taken for this visit.    Wt Readings from Last 3 Encounters:  06/24/21 181 lb (82.1 kg)  06/07/21 181 lb 8 oz (82.3 kg)  03/23/19 166 lb 7.2 oz (75.5 kg)     General: 61 y.o. female in no acute distress. HEENT: Normocephalic and atraumatic. Sclera clear. EOMs intact. Neck: Supple. No carotid bruits. No JVD. Heart: *** RRR. Distinct S1 and S2. No murmurs, gallops, or rubs. Radial and distal pedal pulses 2+ and equal bilaterally. Lungs: No increased work of breathing. Clear to ausculation bilaterally. No wheezes, rhonchi, or rales.  Abdomen: Soft, non-distended, and non-tender to palpation. Bowel sounds present in all 4 quadrants.  MSK: Normal strength and tone for age. *** Extremities: No lower  extremity edema.    Skin: Warm and dry. Neuro: Alert and oriented x3. No focal deficits. Psych: Normal affect. Responds appropriately.   Assessment:    No diagnosis found.  Plan:     Disposition: Follow up in ***   Medication Adjustments/Labs and Tests Ordered: Current medicines are reviewed at length with the patient today.  Concerns regarding medicines are outlined above.  No orders of the defined types were placed in this encounter.  No orders of the defined types were placed in this encounter.   There are no Patient Instructions on file for this visit.   Signed, Corrin Parker, PA-C  12/20/2021 1:46 PM    [facility]

## 2021-12-23 NOTE — Progress Notes (Unsigned)
Cardiology Clinic Note   Patient Name: Miranda Christensen Date of Encounter: 12/24/2021  Primary Care Provider:  Aldean Baker, MD Primary Cardiologist:  Little Ishikawa, MD  Patient Profile    Miranda Christensen is a 61 y.o. female with a past medical history of nonobstructive CAD per [facility] May 2023, hypertension, hyperlipidemia, and GERD who presents to the clinic today for 6 month follow-up of chronic cardiac conditions.   Past Medical History    Past Medical History:  Diagnosis Date   Anxiety and depression    after losing her son 5 years ago   CAD (coronary artery disease)    a. LHC 05/2021: 20% ostial LAD   GERD (gastroesophageal reflux disease)    Hyperlipidemia    Hypertension    Nasal fracture    Past Surgical History:  Procedure Laterality Date   ABDOMINAL HYSTERECTOMY     CARPAL TUNNEL RELEASE Bilateral    CLOSED REDUCTION NASAL FRACTURE Bilateral 03/23/2019   Procedure: CLOSED REDUCTION NASAL FRACTURE;  Surgeon: Christia Reading, MD;  Location: [facility];  Service: ENT;  Laterality: Bilateral;   LEFT HEART CATH AND CORONARY ANGIOGRAPHY N/A 06/08/2021   Procedure: LEFT HEART CATH AND CORONARY ANGIOGRAPHY;  Surgeon: Yvonne Kendall, MD;  Location: [facility];  Service: Cardiovascular;  Laterality: N/A;   PLANTAR FASCIA RELEASE Bilateral    TONSILLECTOMY      Allergies  Allergies  Allergen Reactions   Naproxen Nausea And Vomiting    History of Present Illness    Miranda Christensen has a past medical history of: Nonobstructive CAD. LHC 06/08/2021: Mild, nonobstructive CAD with 20% stenosis ostial LAD. Questionable vasospasm.  Echo 06/06/2021: EF 60-65%. Trivial aortic valve regurgitation.  Hypertension. Hyperlipidemia.  Lipid panel 08/10/2021: LDL 70, HDL 61, TG 99, total 149. GERD.  Patient was initially seen in the hospital for chest pain on 06/06/2021. Her serial troponin was elevated x 3 (63>>85>>83). She underwent LHC which showed  nonobstructive CAD with 20% ostial LAD. She was discharged on aspirin, metoprolol, and high intensity atorvastatin.   She was last seen in the office by Marjie Skiff, PA-C. She was doing well at that time with occasional chest tightness/squeezing. All medications were continued at that time.   Today, patient reports she is doing well. Patient denies shortness of breath or dyspnea on exertion. No chest pain, pressure, or tightness. Denies lower extremity edema, orthopnea, or PND. No palpitations. She has been walking 2 miles up to five times a week on her treadmill.  She has also started hiking. She continues to follow a heart healthy diet and work on weight loss.     Home Medications    Current Meds  Medication Sig   aspirin 81 MG chewable tablet Chew 1 tablet (81 mg total) by mouth daily.   atorvastatin (LIPITOR) 20 MG tablet Take 1 tablet (20 mg total) by mouth daily.   estradiol (ESTRACE) 0.5 MG tablet Take 0.5 mg by mouth daily.   lisinopril (PRINIVIL,ZESTRIL) 10 MG tablet Take 10 mg by mouth daily.   metoprolol succinate (TOPROL-XL) 25 MG 24 hr tablet Take 0.5 tablets (12.5 mg total) by mouth daily.   montelukast (SINGULAIR) 10 MG tablet Take 10 mg by mouth every morning.    pramipexole (MIRAPEX) 0.125 MG tablet Take 1 tablet by mouth at bedtime as needed (for restless leg).    RESTASIS 0.05 % ophthalmic emulsion Place 1 drop into both eyes 2 (two) times daily.   senna (  SENOKOT) 8.6 MG TABS tablet Take 1 tablet by mouth daily as needed for mild constipation.   zolpidem (AMBIEN) 10 MG tablet Take 10 mg by mouth at bedtime.    Family History    Family History  Problem Relation Age of Onset   Cancer Father    Dementia Father    CAD Neg Hx    She indicated that her mother is alive. She indicated that her father is deceased. She indicated that the status of her neg hx is unknown.   Social History    Social History   Socioeconomic History   Marital status: Single    Spouse  name: Not on file   Number of children: Not on file   Years of education: Not on file   Highest education level: Not on file  Occupational History   Occupation: Consulting civil engineer  Tobacco Use   Smoking status: Former    Packs/day: 0.50    Years: 20.00    Total pack years: 10.00    Types: Cigarettes    Quit date: 2008    Years since quitting: 15.9   Smokeless tobacco: Never  Vaping Use   Vaping Use: Never used  Substance and Sexual Activity   Alcohol use: Yes    Comment: rare   Drug use: Never   Sexual activity: Not on file  Other Topics Concern   Not on file  Social History Narrative   Not on file   Social Determinants of Health   Financial Resource Strain: Not on file  Food Insecurity: Not on file  Transportation Needs: Not on file  Physical Activity: Not on file  Stress: Not on file  Social Connections: Not on file  Intimate Partner Violence: Not on file     Review of Systems    General:  No chills, fever, night sweats or weight changes.  Cardiovascular:  No chest pain, dyspnea on exertion, edema, orthopnea, palpitations, paroxysmal nocturnal dyspnea. Dermatological: No rash, lesions/masses Respiratory: No cough, dyspnea Urologic: No hematuria, dysuria Abdominal:   No nausea, vomiting, diarrhea, bright red blood per rectum, melena, or hematemesis Neurologic:  No visual changes, weakness, changes in mental status. All other systems reviewed and are otherwise negative except as noted above.  Physical Exam    VS:  BP 117/82   Pulse 79   Ht 5' 3.5" (1.613 m)   Wt 175 lb 6.4 oz (79.6 kg)   SpO2 97%   BMI 30.58 kg/m  , BMI Body mass index is 30.58 kg/m. GEN:  Well nourished, well developed, in no acute distress. HEENT: Normal. Neck: Supple, no JVD, carotid bruits, or masses. Cardiac: RRR, no murmurs, rubs, or gallops. No clubbing, cyanosis, edema.  Radials/DP/PT 2+ and equal bilaterally.  Respiratory:  Respirations regular and unlabored,  clear to auscultation bilaterally. GI: Soft, nontender, nondistended. MS: No deformity or atrophy. Skin: Warm and dry, no rash. Neuro: Strength and sensation are intact. Psych: Normal affect.  Accessory Clinical Findings    Recent Labs: 06/06/2021: Magnesium 2.1; TSH 1.615 06/08/2021: Hemoglobin 12.9; Platelets 265 07/01/2021: BUN 7; Creatinine, Ser 0.86; Potassium 4.9; Sodium 138 08/10/2021: ALT 22   Recent Lipid Panel    Component Value Date/Time   CHOL 149 08/10/2021 1000   TRIG 99 08/10/2021 1000   HDL 61 08/10/2021 1000   CHOLHDL 2.4 08/10/2021 1000   CHOLHDL 2.7 06/06/2021 1255   VLDL 15 06/06/2021 1255   LDLCALC 70 08/10/2021 1000  ECG not indicated today.       Assessment & Plan   Nonobstructive CAD. LHC 20% ostial LAD, questionable vasospasm May 2023. Patient denies shortness of breath or dyspnea on exertion. No chest pain, pressure, or tightness.  No palpitations. . Continue aspirin, atorvastatin 80 mg, and metoprolol.  Hypertension. BP today 117/82. Patient denies headaches and dizziness. Continue lisinopril and metoprolol.  Hyperlipidemia. LDL 08/10/2021 70, at goal. ALT 22.. Continue atorvastatin 80 mg.       Disposition: Return in one year or sooner as needed.    Miranda Christensen. Miranda Mestre, NP-C     12/24/2021, 3:36 PM [facility] [address] Office [phone] Fax [phone]   I spent 8 minutes examining this patient, reviewing medications, and using patient centered shared decision making involving her cardiac care.  Prior to her visit I spent greater than 20 minutes reviewing her past medical history,  medications, and prior cardiac tests.

## 2021-12-24 ENCOUNTER — Ambulatory Visit: Payer: BC Managed Care – PPO | Attending: Student | Admitting: Student

## 2021-12-24 ENCOUNTER — Encounter: Payer: Self-pay | Admitting: Student

## 2021-12-24 VITALS — BP 117/82 | HR 79 | Ht 63.5 in | Wt 175.4 lb

## 2021-12-24 DIAGNOSIS — I10 Essential (primary) hypertension: Secondary | ICD-10-CM

## 2021-12-24 DIAGNOSIS — E785 Hyperlipidemia, unspecified: Secondary | ICD-10-CM

## 2021-12-24 DIAGNOSIS — I2510 Atherosclerotic heart disease of native coronary artery without angina pectoris: Secondary | ICD-10-CM

## 2021-12-24 NOTE — Patient Instructions (Signed)
Medication Instructions:  Your physician recommends that you continue on your current medications as directed. Please refer to the Current Medication list given to you today.   *If you need a refill on your cardiac medications before your next appointment, please call your pharmacy*   Lab Work: NONE ordered at this time of appointment   If you have labs (blood work) drawn today and your tests are completely normal, you will receive your results only by: MyChart Message (if you have MyChart) OR A paper copy in the mail If you have any lab test that is abnormal or we need to change your treatment, we will call you to review the results.   Testing/Procedures: NONE ordered at this time of appointment     Follow-Up: At [facility], you and your health needs are our priority.  As part of our continuing mission to provide you with exceptional heart care, we have created designated Provider Care Teams.  These Care Teams include your primary Cardiologist (physician) and Advanced Practice Providers (APPs -  Physician Assistants and Nurse Practitioners) who all work together to provide you with the care you need, when you need it.  We recommend signing up for the patient portal called "MyChart".  Sign up information is provided on this After Visit Summary.  MyChart is used to connect with patients for Virtual Visits (Telemedicine).  Patients are able to view lab/test results, encounter notes, upcoming appointments, etc.  Non-urgent messages can be sent to your provider as well.   To learn more about what you can do with MyChart, go to [email or web address].    Your next appointment:   1 year(s)  The format for your next appointment:   In Person  Provider:   Christopher L Schumann, MD     Other Instructions   Important Information About Sugar       

## 2022-03-27 ENCOUNTER — Other Ambulatory Visit: Payer: Self-pay | Admitting: Cardiology

## 2022-11-14 ENCOUNTER — Other Ambulatory Visit: Payer: Self-pay | Admitting: Cardiology

## 2022-12-21 NOTE — Progress Notes (Unsigned)
Cardiology Office Note:    Date:  12/21/2022   ID:  Miranda Christensen, DOB Jan 01, 1961, MRN [id number]  PCP:  Aldean Baker, MD  Cardiologist:  Little Ishikawa, MD { Click to update primary MD,subspecialty MD or APP then REFRESH:1}    Referring MD: Aldean Baker, MD   Chief Complaint: follow-up of CAD  History of Present Illness:    Miranda Christensen is a 62 y.o. female with a history of mild non-obstructive CAD on cardiac catheterization in 05/2021, hypertension, hyperlipidemia, and GERD who is followed by Dr. Bjorn Pippin and presents today for follow-up of CAD.   Patient was admitted in 05/2021 after presenting with chest pain and was found to have minimally elevated troponin. High-sensitivity troponin peaked at 85. Echo showed LVEF of 60-65% with normal wall motion. LHC showed mild non-obstructive CAD with only 20% stenosis of ostial LAD. Vasospasm could not be excluded. She was started on Aspirin and Toprol-XL and home Simvastatin was switched to Lipitor.   She was last seen by Carlos Levering, NP, in 12/2021 at which time she was doing well with no cardiac complaints.   Patient presents today for follow-up. ***   EKGs/Labs/Other Studies Reviewed:    The following studies were reviewed:  Echocardiogram 06/06/2021: Impressions: 1. Left ventricular ejection fraction, by estimation, is 60 to 65%. The  left ventricle has normal function. The left ventricle has no regional  wall motion abnormalities. Left ventricular diastolic parameters were  normal.   2. Right ventricular systolic function is normal. The right ventricular  size is normal. Tricuspid regurgitation signal is inadequate for assessing  PA pressure.   3. The mitral valve is normal in structure. No evidence of mitral valve  regurgitation. No evidence of mitral stenosis.   4. The aortic valve is tricuspid. Aortic valve regurgitation is trivial.  No aortic stenosis is present.   5. The inferior vena cava is normal  in size with greater than 50%  respiratory variability, suggesting right atrial pressure of 3 mmHg.  _______________  Left Cardiac Catheterization 06/08/2021: Conclusions: Mild, nonobstructive coronary artery disease with 20% stenosis at the ostium of the LAD.  An element of vasospasm cannot be excluded.  Otherwise, there is no angiographically significant coronary artery disease. Mildly elevated left ventricular filling pressure (LVEDP 16 mmHg).   Recommendations: No clear culprit lesion identified for patient's chest pain and elevated troponin.  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  Consider further work-up for other potential etiologies of chest pain and elevated troponin per primary teams. Medical therapy and risk factor modification to prevent progression of mild coronary artery disease.  Diagnostic Dominance: Right      EKG:  EKG  ordered today.   EKG Interpretation Date/Time:  Friday December 31 2022 10:26:24 EST Ventricular Rate:  70 PR Interval:  154 QRS Duration:  80 QT Interval:  404 QTC Calculation: 436 R Axis:   -13  Text Interpretation: Normal sinus rhythm Minimal voltage criteria for LVH, may be normal variant ( R in aVL ) When compared with ECG of 07-Jun-2021 05:57, No significant change was found Confirmed by Marjie Skiff [id number]) on 12/31/2022 10:32:03 AM    Recent Labs: No results found for requested labs within last 365 days.  Recent Lipid Panel    Component Value Date/Time   CHOL 149 08/10/2021 1000   TRIG 99 08/10/2021 1000   HDL 61 08/10/2021 1000   CHOLHDL 2.4 08/10/2021 1000   CHOLHDL 2.7 06/06/2021 1255  VLDL 15 06/06/2021 1255   LDLCALC 70 08/10/2021 1000    Physical Exam:    Vital Signs: There were no vitals taken for this visit.    Wt Readings from Last 3 Encounters:  12/24/21 175 lb 6.4 oz (79.6 kg)  06/24/21 181 lb (82.1 kg)  06/07/21 181 lb 8 oz (82.3 kg)     General: 62 y.o.  Caucasian female in no acute distress. HEENT: Normocephalic and atraumatic. Sclera clear.  Neck: Supple. No JVD. Heart: RRR. Distinct S1 and S2. No murmurs, gallops, or rubs.  Lungs: No increased work of breathing. Clear to ausculation bilaterally. No wheezes, rhonchi, or rales.  Extremities: No lower extremity edema.   Skin: Warm and dry. Neuro: No focal deficits. Psych: Normal affect. Responds appropriately.   Assessment:    No diagnosis found.  Plan:    Non-Obstructive CAD LHC in 05/2021 showed minimal non-obstructive CAD. Vasospasm could not be excluded.  - No chest pain. *** - Continue aspirin, beta-blocker, and high-intensity statin.   Hypertension BP well controlled. *** - Continue Toprol-XL 12.5mg  daily and Lisinopril 10mg  daily.   Hyperlipidemia Lipid panel in 08/2022: Total Cholesterol 168, Triglycerides 78, HDL 64, LDL 87. LDL goal <70 given CAD. - Currently on Lipitor 20mg  daily. Will increase to 40mg  daily. *** - Will repeat lipid panel and LFTs in 6-8 weeks. ***  Disposition: Follow up in ***   Signed, Corrin Parker, PA-C  12/21/2022 10:59 AM    [facility]

## 2022-12-31 ENCOUNTER — Ambulatory Visit: Payer: BC Managed Care – PPO | Attending: Student | Admitting: Student

## 2022-12-31 ENCOUNTER — Encounter: Payer: Self-pay | Admitting: Student

## 2022-12-31 VITALS — BP 134/86 | HR 70 | Ht 63.5 in | Wt 190.6 lb

## 2022-12-31 DIAGNOSIS — E785 Hyperlipidemia, unspecified: Secondary | ICD-10-CM

## 2022-12-31 DIAGNOSIS — E66811 Obesity, class 1: Secondary | ICD-10-CM | POA: Diagnosis not present

## 2022-12-31 DIAGNOSIS — I2510 Atherosclerotic heart disease of native coronary artery without angina pectoris: Secondary | ICD-10-CM

## 2022-12-31 DIAGNOSIS — I10 Essential (primary) hypertension: Secondary | ICD-10-CM | POA: Diagnosis not present

## 2022-12-31 MED ORDER — ATORVASTATIN CALCIUM 40 MG PO TABS: 40.0000 mg | ORAL_TABLET | Freq: Every day | ORAL | 3 refills | Status: DC

## 2022-12-31 NOTE — Patient Instructions (Signed)
Medication Instructions:  INCREASE ATORVASTATIN 40MG  DAILY (MAY TAKE 2 FOR YOUR 20MG ) *If you need a refill on your cardiac medications before your next appointment, please call your pharmacy*  Lab Work: DO YOUR LAB WITH YOUR PRIMARY MD  Follow-Up: At [facility], you and your health needs are our priority.  As part of our continuing mission to provide you with exceptional heart care, we have created designated Provider Care Teams.  These Care Teams include your primary Cardiologist (physician) and Advanced Practice Providers (APPs -  Physician Assistants and Nurse Practitioners) who all work together to provide you with the care you need, when you need it.  Your next appointment:   12 month(s)  Provider:   Little Ishikawa, MD

## 2023-01-01 ENCOUNTER — Encounter: Payer: Self-pay | Admitting: Student

## 2023-08-09 ENCOUNTER — Other Ambulatory Visit: Payer: Self-pay | Admitting: Cardiology

## 2024-02-01 ENCOUNTER — Other Ambulatory Visit: Payer: Self-pay | Admitting: Cardiology

## 2024-02-02 NOTE — Progress Notes (Unsigned)
 "  Cardiology Office Note    Date:  02/03/2024  ID:  [name], DOB 04-23-1960, MRN [id number] PCP:  Miranda Gravely, MD (Inactive)  Cardiologist:  Lonni LITTIE Nanas, MD  Electrophysiologist:  None   Chief Complaint: Follow up for nonobstructive CAD   History of Present Illness: .   Miranda Christensen is a 64 y.o. female with visit-pertinent history of mild nonobstructive CAD on cardiac catheterization in 05/2021, hypertension, hyperlipidemia and GERD.  Patient was admitted in 05/2021 after presenting with chest pain, found to have minimally elevated troponin.  High sensitive troponin peaked at 85.  Echo showed LVEF of 60 to 65% with normal wall motion.  LHC showed mild nonobstructive CAD with only 20% stenosis of ostial LAD.  Coronary vasospasm could not be excluded.  She was started on aspirin  and Toprol -XL and home simvastatin  will switch to Lipitor .  Patient was last in in clinic on 12/31/2022 by Aline Door, PA.  She was doing well from a cardiac standpoint.  She did note some mild shortness of breath when walking uphill, treated this to weight gain over the last year.  Today she presents for follow-up.  She reports that she has been doing well overall.  She denies any chest pain, shortness of breath, lower extremity edema, orthopnea or PND.  She denies any palpitations, presyncope or syncope.  Patient reports that she regularly exercises, uses a rowing machine, walks and does yoga multiple times a week, tolerates well.  Patient reports that she has lost 40 pounds in the last year, with this has noted some lower blood pressures at home has been alternating the days in which she takes metoprolol  and lisinopril , notes she has had improvement with this.  Patient works at [employer] and also regularly has to walk 3 miles when at work.  Labwork independently reviewed: 08/25/2023: Hemoglobin 13.9, hematocrit 41.2, sodium 140, potassium 5.1, creatinine 0.86, AST 21, ALT  22 ROS: .   Today she denies chest pain, shortness of breath, lower extremity edema, fatigue, palpitations, melena, hematuria, hemoptysis, diaphoresis, weakness, presyncope, syncope, orthopnea, and PND.  All other systems are reviewed and otherwise negative. Studies Reviewed: SABRA   EKG:  EKG is ordered today, personally reviewed, demonstrating  EKG Interpretation Date/Time:  Friday February 03 2024 11:05:40 EST Ventricular Rate:  62 PR Interval:  148 QRS Duration:  72 QT Interval:  404 QTC Calculation: 410 R Axis:   -1  Text Interpretation: Normal sinus rhythm Normal ECG When compared with ECG of 31-Dec-2022 10:26, No significant change was found Confirmed by Latissa Frick [phone]) on 02/03/2024 11:14:03 AM   CV Studies: Cardiac studies reviewed are outlined and summarized above. Otherwise please see EMR for full report. Cardiac Studies & Procedures   ______________________________________________________________________________________________ CARDIAC CATHETERIZATION  CARDIAC CATHETERIZATION 06/08/2021  Conclusion Conclusions: Mild, nonobstructive coronary artery disease with 20% stenosis at the ostium of the LAD.  An element of vasospasm cannot be excluded.  Otherwise, there is no angiographically significant coronary artery disease. Mildly elevated left ventricular filling pressure (LVEDP 16 mmHg).  Recommendations: No clear culprit lesion identified for patient's chest pain and elevated troponin.  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  Consider further work-up for other potential etiologies of chest pain and elevated troponin per primary teams. Medical therapy and risk factor modification to prevent progression of mild coronary artery disease.  Lonni Hanson, MD [facility]  Findings Coronary Findings Diagnostic  Dominance: Right  Left Main Vessel  is large. Vessel is angiographically normal.  Left Anterior Descending Vessel  is large. Ost LAD lesion is 20% stenosed.  First Diagonal Branch Vessel is small in size.  Second Diagonal Branch Vessel is large in size.  Ramus Intermedius Vessel is small. Vessel is angiographically normal.  Left Circumflex Vessel is large. Vessel is angiographically normal.  First Obtuse Marginal Branch Vessel is moderate in size.  Second Obtuse Marginal Branch Vessel is large in size.  Third Obtuse Marginal Branch Vessel is small in size.  Right Coronary Artery Vessel is large. Vessel is angiographically normal.  Right Posterior Descending Artery Vessel is large in size.  Right Posterior Atrioventricular Artery Vessel is moderate in size.  First Right Posterolateral Branch Vessel is moderate in size.  Second Right Posterolateral Branch Vessel is small in size.  Intervention  No interventions have been documented.     ECHOCARDIOGRAM  ECHOCARDIOGRAM COMPLETE 06/06/2021  Narrative ECHOCARDIOGRAM REPORT    Patient Name:   Miranda Christensen Date of Exam: 06/06/2021 Medical Rec #:  [id number]     Height:       63.0 in Accession #:    [id number]    Weight:       182.3 lb Date of Birth:  1960-04-19     BSA:          1.859 m Patient Age:    61 years      BP:           127/80 mmHg Patient Gender: F             HR:           58 bpm. Exam Location:  Inpatient  Procedure: 2D Echo  Indications:    chest pain  History:        Patient has no prior history of Echocardiogram examinations. Risk Factors:Hypertension and Dyslipidemia.  Sonographer:    Tinnie Barefoot RDCS Referring Phys: [id number] LONNI LITTIE NANAS   Sonographer Comments: Image acquisition challenging due to respiratory motion. IMPRESSIONS   1. Left ventricular ejection fraction, by estimation, is 60 to 65%. The left ventricle has normal function. The left ventricle has no regional wall motion abnormalities. Left ventricular diastolic parameters were normal. 2. Right ventricular systolic  function is normal. The right ventricular size is normal. Tricuspid regurgitation signal is inadequate for assessing PA pressure. 3. The mitral valve is normal in structure. No evidence of mitral valve regurgitation. No evidence of mitral stenosis. 4. The aortic valve is tricuspid. Aortic valve regurgitation is trivial. No aortic stenosis is present. 5. The inferior vena cava is normal in size with greater than 50% respiratory variability, suggesting right atrial pressure of 3 mmHg.  FINDINGS Left Ventricle: Left ventricular ejection fraction, by estimation, is 60 to 65%. The left ventricle has normal function. The left ventricle has no regional wall motion abnormalities. The left ventricular internal cavity size was normal in size. There is no left ventricular hypertrophy. Left ventricular diastolic parameters were normal.  Right Ventricle: The right ventricular size is normal. No increase in right ventricular wall thickness. Right ventricular systolic function is normal. Tricuspid regurgitation signal is inadequate for assessing PA pressure.  Left Atrium: Left atrial size was normal in size.  Right Atrium: Right atrial size was normal in size.  Pericardium: There is no evidence of pericardial effusion.  Mitral Valve: The mitral valve is normal in structure. No evidence of mitral valve regurgitation. No evidence of mitral valve stenosis.  Tricuspid Valve: The tricuspid valve is  normal in structure. Tricuspid valve regurgitation is not demonstrated.  Aortic Valve: The aortic valve is tricuspid. Aortic valve regurgitation is trivial. No aortic stenosis is present.  Pulmonic Valve: The pulmonic valve was not well visualized. Pulmonic valve regurgitation is not visualized.  Aorta: The aortic root and ascending aorta are structurally normal, with no evidence of dilitation.  Venous: The inferior vena cava is normal in size with greater than 50% respiratory variability, suggesting right atrial  pressure of 3 mmHg.  IAS/Shunts: The interatrial septum was not well visualized.   LEFT VENTRICLE PLAX 2D LVIDd:         4.30 cm   Diastology LVIDs:         2.90 cm   LV e' medial:    10.70 cm/s LV PW:         1.00 cm   LV E/e' medial:  5.4 LV IVS:        0.90 cm   LV e' lateral:   12.50 cm/s LVOT diam:     1.90 cm   LV E/e' lateral: 4.6 LV SV:         46 LV SV Index:   25 LVOT Area:     2.84 cm   RIGHT VENTRICLE RV S prime:     10.40 cm/s TAPSE (M-mode): 2.4 cm  LEFT ATRIUM             Index        RIGHT ATRIUM           Index LA diam:        3.60 cm 1.94 cm/m   RA Area:     11.90 cm LA Vol (A2C):   40.2 ml 21.62 ml/m  RA Volume:   27.60 ml  14.85 ml/m LA Vol (A4C):   37.7 ml 20.28 ml/m LA Biplane Vol: 39.2 ml 21.09 ml/m AORTIC VALVE LVOT Vmax:   73.70 cm/s LVOT Vmean:  46.700 cm/s LVOT VTI:    0.161 m  AORTA Ao Root diam: 2.90 cm Ao Asc diam:  3.30 cm  MITRAL VALVE MV Area (PHT): 4.49 cm    SHUNTS MV Decel Time: 169 msec    Systemic VTI:  0.16 m MV E velocity: 57.50 cm/s  Systemic Diam: 1.90 cm MV A velocity: 66.30 cm/s MV E/A ratio:  0.87  Lonni Nanas MD Electronically signed by Lonni Nanas MD Signature Date/Time: 06/06/2021/6:20:13 PM    Final          ______________________________________________________________________________________________       Current Reported Medications:.    Active Medications[1]  Physical Exam:    VS:  BP 118/86   Pulse 62   Ht 5' 3 (1.6 m)   Wt 141 lb 6.4 oz (64.1 kg)   SpO2 98%   BMI 25.05 kg/m    Wt Readings from Last 3 Encounters:  02/03/24 141 lb 6.4 oz (64.1 kg)  12/31/22 190 lb 9.6 oz (86.5 kg)  12/24/21 175 lb 6.4 oz (79.6 kg)    GEN: Well nourished, well developed in no acute distress NECK: No JVD; No carotid bruits CARDIAC: RRR, no murmurs, rubs, gallops RESPIRATORY:  Clear to auscultation without rales, wheezing or rhonchi  ABDOMEN: Soft, non-tender,  non-distended EXTREMITIES:  No edema; No acute deformity     Asessement and Plan:.    Nonobstructive CAD: LHC in 05/2021 showed minimal nonobstructive CAD.  Vasospasm could not be excluded. Stable with no anginal symptoms. No indication for ischemic evaluation.  Heart healthy diet  and regular cardiovascular exercise encouraged.  Reviewed ED precautions.  Continue aspirin  81 mg daily, Lipitor  40 mg daily, lisinopril  10 mg daily.  Will discontinue metoprolol  as notable low.  Hypertension: Blood pressure today 124/78, patient reports that today she took lisinopril .  Patient has been alternating days in which she takes metoprolol  or lisinopril , reports when she takes both medications she has low blood pressure associated with dizziness and lightheadedness.  EKG today indicates normal sinus rhythm at 62 bpm, patient denies any palpitations or feeling of irregular heartbeats.  Will discontinue metoprolol , encourage patient to resume lisinopril  10 mg daily, encourage patient to monitor blood pressure at home and notify the office if persistently elevated above 130/80.  Hyperlipidemia: Last lipid profile indicated total cholesterol 155, triglycerides 88, HDL 55 and LDL 82.  Discussed with patient that LDL goal is less than 70, patient has had significant weight loss in recent months.  Check fasting lipid profile and LFTs today, if LDL less than 70 recommend increasing Lipitor  to 80 mg daily or adding Zetia.   Disposition: F/u with Dr. Kate or Bettyann Birchler, NP in one year or sooner if needed.   Signed, Oliva Montecalvo D Pearley Baranek, NP       [1]  Current Meds  Medication Sig   aspirin  81 MG chewable tablet Chew 1 tablet (81 mg total) by mouth daily.   atorvastatin  (LIPITOR ) 40 MG tablet Take 1 tablet (40 mg total) by mouth daily.   estradiol (ESTRACE) 0.5 MG tablet Take 0.5 mg by mouth daily.   lisinopril  (PRINIVIL ,ZESTRIL ) 10 MG tablet Take 10 mg by mouth daily.   metoprolol  succinate (TOPROL -XL) 25 MG 24 hr  tablet TAKE 1/2 TABLET BY MOUTH DAILY   pramipexole (MIRAPEX) 0.125 MG tablet Take 1 tablet by mouth at bedtime as needed (for restless leg).    RESTASIS  0.05 % ophthalmic emulsion Place 1 drop into both eyes 2 (two) times daily.   senna (SENOKOT) 8.6 MG TABS tablet Take 1 tablet by mouth daily as needed for mild constipation.   zolpidem  (AMBIEN ) 10 MG tablet Take 10 mg by mouth at bedtime.   "

## 2024-02-03 ENCOUNTER — Encounter: Payer: Self-pay | Admitting: Cardiology

## 2024-02-03 ENCOUNTER — Ambulatory Visit: Admitting: Cardiology

## 2024-02-03 VITALS — BP 124/78 | HR 62 | Ht 63.0 in | Wt 141.4 lb

## 2024-02-03 DIAGNOSIS — E785 Hyperlipidemia, unspecified: Secondary | ICD-10-CM

## 2024-02-03 DIAGNOSIS — Z79899 Other long term (current) drug therapy: Secondary | ICD-10-CM

## 2024-02-03 DIAGNOSIS — I2510 Atherosclerotic heart disease of native coronary artery without angina pectoris: Secondary | ICD-10-CM

## 2024-02-03 DIAGNOSIS — I10 Essential (primary) hypertension: Secondary | ICD-10-CM | POA: Diagnosis not present

## 2024-02-03 LAB — HEPATIC FUNCTION PANEL
ALT: 23 IU/L (ref 0–32)
AST: 22 IU/L (ref 0–40)
Albumin: 4.6 g/dL (ref 3.9–4.9)
Alkaline Phosphatase: 112 IU/L (ref 49–135)
Bilirubin Total: 0.6 mg/dL (ref 0.0–1.2)
Bilirubin, Direct: 0.2 mg/dL (ref 0.00–0.40)
Total Protein: 6.9 g/dL (ref 6.0–8.5)

## 2024-02-03 LAB — LIPID PANEL
Chol/HDL Ratio: 3.1 ratio (ref 0.0–4.4)
Cholesterol, Total: 196 mg/dL (ref 100–199)
HDL: 64 mg/dL
LDL Chol Calc (NIH): 114 mg/dL — ABNORMAL HIGH (ref 0–99)
Triglycerides: 101 mg/dL (ref 0–149)
VLDL Cholesterol Cal: 18 mg/dL (ref 5–40)

## 2024-02-03 NOTE — Patient Instructions (Signed)
 Medication Instructions:  STOP: Metoprolol  Succinate   Take Lisinopril  10 mg (1 tablet) daily  *If you need a refill on your cardiac medications before your next appointment, please call your pharmacy*  Lab Work: TODAY: Fasting Lipids, LFTs  If you have labs (blood work) drawn today and your tests are completely normal, you will receive your results only by: MyChart Message (if you have MyChart) OR A paper copy in the mail If you have any lab test that is abnormal or we need to change your treatment, we will call you to review the results.  Testing/Procedures: NONE  Follow-Up: At [facility], you and your health needs are our priority.  As part of our continuing mission to provide you with exceptional heart care, our providers are all part of one team.  This team includes your primary Cardiologist (physician) and Advanced Practice Providers or APPs (Physician Assistants and Nurse Practitioners) who all work together to provide you with the care you need, when you need it.  Your next appointment:   1 year(s)  Provider:   Lonni LITTIE Nanas, MD

## 2024-02-06 ENCOUNTER — Ambulatory Visit: Payer: Self-pay | Admitting: Cardiology

## 2024-02-06 DIAGNOSIS — Z79899 Other long term (current) drug therapy: Secondary | ICD-10-CM

## 2024-02-07 MED ORDER — ATORVASTATIN CALCIUM 80 MG PO TABS: 80.0000 mg | ORAL_TABLET | Freq: Every day | ORAL | 11 refills | Status: AC
